# Patient Record
Sex: Female | Born: 1970 | Race: White | Hispanic: No | Marital: Married | Smoking: Former smoker
Health system: Southern US, Community
[De-identification: ages and names within clinical notes are randomized; demographics above are authoritative.]

## PROBLEM LIST (undated history)

## (undated) DIAGNOSIS — N979 Female infertility, unspecified: Secondary | ICD-10-CM

## (undated) DIAGNOSIS — I10 Essential (primary) hypertension: Secondary | ICD-10-CM

## (undated) DIAGNOSIS — R011 Cardiac murmur, unspecified: Secondary | ICD-10-CM

## (undated) DIAGNOSIS — K219 Gastro-esophageal reflux disease without esophagitis: Secondary | ICD-10-CM

## (undated) DIAGNOSIS — J309 Allergic rhinitis, unspecified: Secondary | ICD-10-CM

## (undated) DIAGNOSIS — G4730 Sleep apnea, unspecified: Secondary | ICD-10-CM

## (undated) DIAGNOSIS — D649 Anemia, unspecified: Secondary | ICD-10-CM

## (undated) DIAGNOSIS — D219 Benign neoplasm of connective and other soft tissue, unspecified: Secondary | ICD-10-CM

## (undated) DIAGNOSIS — F329 Major depressive disorder, single episode, unspecified: Secondary | ICD-10-CM

## (undated) DIAGNOSIS — E669 Obesity, unspecified: Secondary | ICD-10-CM

## (undated) DIAGNOSIS — F32A Depression, unspecified: Secondary | ICD-10-CM

## (undated) DIAGNOSIS — K631 Perforation of intestine (nontraumatic): Secondary | ICD-10-CM

## (undated) DIAGNOSIS — K567 Ileus, unspecified: Secondary | ICD-10-CM

## (undated) DIAGNOSIS — I499 Cardiac arrhythmia, unspecified: Secondary | ICD-10-CM

## (undated) HISTORY — PX: DIAGNOSTIC LAPAROSCOPY: SUR761

## (undated) HISTORY — PX: LAPAROSCOPIC GASTRIC BANDING: SHX1100

## (undated) HISTORY — PX: ENDOMETRIAL BIOPSY: SHX622

## (undated) HISTORY — PX: OTHER SURGICAL HISTORY: SHX169

## (undated) HISTORY — PX: LAPAROSCOPIC GASTRIC BAND REMOVAL WITH LAPAROSCOPIC GASTRIC SLEEVE RESECTION: SHX6498

## (undated) HISTORY — PX: ESOPHAGOGASTRODUODENOSCOPY: SHX1529

## (undated) HISTORY — PX: INDUCED ABORTION: SHX677

## (undated) HISTORY — PX: CHOLECYSTECTOMY: SHX55

## (undated) SURGERY — Surgical Case
Anesthesia: *Unknown

---

## 2005-08-07 ENCOUNTER — Emergency Department: Payer: Self-pay | Admitting: General Practice

## 2006-10-08 ENCOUNTER — Ambulatory Visit: Payer: Self-pay | Admitting: Family Medicine

## 2007-01-15 ENCOUNTER — Ambulatory Visit: Payer: Self-pay | Admitting: Emergency Medicine

## 2008-07-20 ENCOUNTER — Ambulatory Visit: Payer: Self-pay | Admitting: Family Medicine

## 2011-05-25 ENCOUNTER — Ambulatory Visit: Payer: Self-pay | Admitting: Internal Medicine

## 2013-07-24 ENCOUNTER — Ambulatory Visit: Payer: Self-pay | Admitting: Obstetrics and Gynecology

## 2013-08-31 ENCOUNTER — Emergency Department: Payer: Self-pay | Admitting: Emergency Medicine

## 2013-08-31 LAB — URINALYSIS, COMPLETE
Bilirubin,UR: NEGATIVE
Glucose,UR: NEGATIVE mg/dL (ref 0–75)
Ketone: NEGATIVE
LEUKOCYTE ESTERASE: NEGATIVE
Nitrite: NEGATIVE
PROTEIN: NEGATIVE
Ph: 5 (ref 4.5–8.0)
SPECIFIC GRAVITY: 1.019 (ref 1.003–1.030)

## 2013-08-31 LAB — COMPREHENSIVE METABOLIC PANEL
AST: 19 U/L (ref 15–37)
Albumin: 3.2 g/dL — ABNORMAL LOW (ref 3.4–5.0)
Alkaline Phosphatase: 46 U/L
Anion Gap: 6 — ABNORMAL LOW (ref 7–16)
BUN: 13 mg/dL (ref 7–18)
Bilirubin,Total: 0.4 mg/dL (ref 0.2–1.0)
CALCIUM: 8.5 mg/dL (ref 8.5–10.1)
CHLORIDE: 110 mmol/L — AB (ref 98–107)
CO2: 21 mmol/L (ref 21–32)
Creatinine: 0.93 mg/dL (ref 0.60–1.30)
GLUCOSE: 104 mg/dL — AB (ref 65–99)
Osmolality: 274 (ref 275–301)
POTASSIUM: 3.9 mmol/L (ref 3.5–5.1)
SGPT (ALT): 20 U/L (ref 12–78)
Sodium: 137 mmol/L (ref 136–145)
TOTAL PROTEIN: 7.5 g/dL (ref 6.4–8.2)

## 2013-08-31 LAB — CBC WITH DIFFERENTIAL/PLATELET
BASOS ABS: 0.1 10*3/uL (ref 0.0–0.1)
Basophil %: 0.4 %
Eosinophil #: 0.1 10*3/uL (ref 0.0–0.7)
Eosinophil %: 0.9 %
HCT: 41.6 % (ref 35.0–47.0)
HGB: 13.9 g/dL (ref 12.0–16.0)
Lymphocyte #: 0.5 10*3/uL — ABNORMAL LOW (ref 1.0–3.6)
Lymphocyte %: 4.3 %
MCH: 29.7 pg (ref 26.0–34.0)
MCHC: 33.4 g/dL (ref 32.0–36.0)
MCV: 89 fL (ref 80–100)
Monocyte #: 0.4 x10 3/mm (ref 0.2–0.9)
Monocyte %: 3.3 %
NEUTROS ABS: 10.8 10*3/uL — AB (ref 1.4–6.5)
Neutrophil %: 91.1 %
Platelet: 267 10*3/uL (ref 150–440)
RBC: 4.68 10*6/uL (ref 3.80–5.20)
RDW: 14 % (ref 11.5–14.5)
WBC: 11.9 10*3/uL — AB (ref 3.6–11.0)

## 2013-08-31 LAB — LIPASE, BLOOD: Lipase: 90 U/L (ref 73–393)

## 2014-03-15 ENCOUNTER — Ambulatory Visit: Payer: Self-pay

## 2014-03-15 ENCOUNTER — Ambulatory Visit: Payer: Self-pay | Admitting: Physician Assistant

## 2014-03-15 LAB — COMPREHENSIVE METABOLIC PANEL
ALBUMIN: 3.2 g/dL — AB (ref 3.4–5.0)
ANION GAP: 6 — AB (ref 7–16)
Alkaline Phosphatase: 59 U/L
BILIRUBIN TOTAL: 0.2 mg/dL (ref 0.2–1.0)
BUN: 15 mg/dL (ref 7–18)
CALCIUM: 8.9 mg/dL (ref 8.5–10.1)
CHLORIDE: 102 mmol/L (ref 98–107)
CO2: 29 mmol/L (ref 21–32)
Creatinine: 0.83 mg/dL (ref 0.60–1.30)
EGFR (African American): >60
Glucose: 78 mg/dL (ref 65–99)
OSMOLALITY: 274 (ref 275–301)
Potassium: 3.7 mmol/L (ref 3.5–5.1)
SGOT(AST): 17 U/L (ref 15–37)
SGPT (ALT): 22 U/L
Sodium: 137 mmol/L (ref 136–145)
Total Protein: 7.4 g/dL (ref 6.4–8.2)

## 2014-03-15 LAB — CBC WITH DIFFERENTIAL/PLATELET
BASOS ABS: 0.1 10*3/uL (ref 0.0–0.1)
Basophil %: 0.8 %
EOS ABS: 0.3 10*3/uL (ref 0.0–0.7)
Eosinophil %: 2.1 %
HCT: 41 % (ref 35.0–47.0)
HGB: 13.1 g/dL (ref 12.0–16.0)
LYMPHS PCT: 27.5 %
Lymphocyte #: 3.4 10*3/uL (ref 1.0–3.6)
MCH: 28.7 pg (ref 26.0–34.0)
MCHC: 31.9 g/dL — ABNORMAL LOW (ref 32.0–36.0)
MCV: 90 fL (ref 80–100)
MONOS PCT: 7.8 %
Monocyte #: 1 x10 3/mm — ABNORMAL HIGH (ref 0.2–0.9)
NEUTROS ABS: 7.6 10*3/uL — AB (ref 1.4–6.5)
Neutrophil %: 61.8 %
Platelet: 302 10*3/uL (ref 150–440)
RBC: 4.55 10*6/uL (ref 3.80–5.20)
RDW: 14.4 % (ref 11.5–14.5)
WBC: 12.4 10*3/uL — ABNORMAL HIGH (ref 3.6–11.0)

## 2014-03-15 LAB — LIPASE, BLOOD: LIPASE: 84 U/L (ref 73–393)

## 2014-03-15 LAB — AMYLASE: Amylase: 28 U/L (ref 25–115)

## 2014-06-10 ENCOUNTER — Ambulatory Visit: Payer: Self-pay | Admitting: Emergency Medicine

## 2014-11-04 ENCOUNTER — Encounter: Payer: Self-pay | Admitting: Emergency Medicine

## 2014-11-04 ENCOUNTER — Ambulatory Visit
Admission: EM | Admit: 2014-11-04 | Discharge: 2014-11-04 | Disposition: A | Discharge status: Home / Self Care | Payer: BC Managed Care – PPO | Attending: Internal Medicine | Admitting: Internal Medicine

## 2014-11-04 DIAGNOSIS — Z8249 Family history of ischemic heart disease and other diseases of the circulatory system: Secondary | ICD-10-CM | POA: Insufficient documentation

## 2014-11-04 DIAGNOSIS — F17210 Nicotine dependence, cigarettes, uncomplicated: Secondary | ICD-10-CM | POA: Insufficient documentation

## 2014-11-04 DIAGNOSIS — Z9049 Acquired absence of other specified parts of digestive tract: Secondary | ICD-10-CM | POA: Insufficient documentation

## 2014-11-04 DIAGNOSIS — R0789 Other chest pain: Secondary | ICD-10-CM | POA: Insufficient documentation

## 2014-11-04 DIAGNOSIS — R002 Palpitations: Secondary | ICD-10-CM

## 2014-11-04 DIAGNOSIS — F329 Major depressive disorder, single episode, unspecified: Secondary | ICD-10-CM | POA: Insufficient documentation

## 2014-11-04 HISTORY — DX: Major depressive disorder, single episode, unspecified: F32.9

## 2014-11-04 HISTORY — DX: Depression, unspecified: F32.A

## 2014-11-04 NOTE — ED Provider Notes (Signed)
CSN: [id number]     Arrival date & time 11/04/14  1454 History   First MD Initiated Contact with Patient 11/04/14 1519     Chief Complaint  Patient presents with  . Chest Pain  . Palpitations    HPI  Patient presents with history of palpitations and vague chest discomfort that are more prominent and usual for the last week. Episodes of palpitations and chest discomfort last for a couple minutes at a time, maybe every other day. She has been noticing this particularly at bedtime when she is going to bed.  She denies excessive caffeine intake, drinks one large coffee over the course of several hours in the morning. She denies use of energy drinks, caffeinated beverages, tea.  She has been sleeping well, denies sleeplessness, excessive stress. She does have a history of sleep apnea, formerly treated with a CPAP device. She has lost a lot of weight, and gave away her CPAP device. Recently she has regained the weight, and thinks she might be having some difficulty with sleep apnea. She gives some history of daytime somnolence.  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 Past Medical History  Diagnosis Date  . Depression    she has been taking Celexa at a stable dose for about 6 years  She denies hypertension, diabetes, hyperlipidemia. She is followed by Gaetano Net at [facility]  Past Surgical History  Procedure Laterality Date  . Cholecystectomy     Family History  Problem Relation Age of Onset  . Heart attack Father    father's first heart attack was about the age of 20. History  Substance Use Topics  . Smoking status: Current Every Day Smoker -- 0.50 packs/day  . Smokeless tobacco: Never Used  . Alcohol Use: 7.2 oz/week    12 Cans of beer per week   patient has been a half pack per day smoker for 20 years OB History    No data available      Review of Systems  All other systems reviewed and are negative.     Allergies  Shrimp  Home Medications   Prior to Admission medications   Medication Sig Start Date End Date Taking? Authorizing Provider  citalopram (CELEXA) 40 MG tablet Take 40 mg by mouth daily.   Yes Historical Provider, MD   BP 114/71 mmHg  Pulse 79  Temp(Src) 96.4 F (35.8 C) (Tympanic)  Ht 5\' 2"  (1.575 m)  Wt 250 lb (113.399 kg)  BMI 45.71 kg/m2  SpO2 98%  LMP  (Approximate) Physical Exam  Constitutional: She is oriented to person, place, and time. She appears well-developed and well-nourished.  HENT:  Head: Atraumatic.  Eyes: EOM are normal.  Neck: Neck supple.  Cardiovascular: Normal rate and normal heart sounds.  Exam reveals no gallop.   No murmur heard. Pulmonary/Chest: Breath sounds normal. No respiratory distress. She has no wheezes. She has no rhonchi.  Abdominal: Soft. She exhibits no distension. There is no tenderness. There is no rebound and no guarding.  Neurological: She is alert and oriented to person, place, and time.  Skin: Skin is warm and dry.  Nursing note and vitals reviewed. no leg swelling   ED Course  Procedures (including critical care time) Labs Review Labs Reviewed - No data to display  Imaging Review No results found.   MDM  No diagnosis found.  palpitations, atypical chest pain. ECGs x 2 reviewed, approx 55 min apart: no acute/evolving ST or T wave changes, QTc unremarkable.  NSR.  No dysrhythmia/ectopy noted.  Minimal cardiac risk factors, but does have hx sleep apnea (with possibility of recurrence), and risk of pulmonary hypertension/dysrhythmia on that basis.  Will followup pcp to discuss further evaluation for this.  Reassurance provided re: continuing to take celexa.   Sherlene Shams, MD 11/04/14 [phone]

## 2014-11-04 NOTE — ED Notes (Signed)
Patient c/o chest pain and palpitations for a week.  Patient denies N/V.  Patient denies SOB.

## 2015-11-06 ENCOUNTER — Other Ambulatory Visit: Payer: Self-pay | Admitting: Family Medicine

## 2015-11-06 DIAGNOSIS — Z1231 Encounter for screening mammogram for malignant neoplasm of breast: Secondary | ICD-10-CM

## 2015-11-20 ENCOUNTER — Ambulatory Visit
Admission: RE | Admit: 2015-11-20 | Discharge: 2015-11-20 | Disposition: A | Discharge status: Home / Self Care | Payer: BC Managed Care – PPO | Source: Ambulatory Visit | Attending: Family Medicine | Admitting: Family Medicine

## 2015-11-20 DIAGNOSIS — Z1231 Encounter for screening mammogram for malignant neoplasm of breast: Secondary | ICD-10-CM | POA: Insufficient documentation

## 2016-03-11 ENCOUNTER — Other Ambulatory Visit: Payer: Self-pay | Admitting: Gastroenterology

## 2016-03-11 DIAGNOSIS — R195 Other fecal abnormalities: Secondary | ICD-10-CM

## 2016-03-11 DIAGNOSIS — R748 Abnormal levels of other serum enzymes: Secondary | ICD-10-CM

## 2016-03-16 ENCOUNTER — Ambulatory Visit
Admission: RE | Admit: 2016-03-16 | Discharge: 2016-03-16 | Disposition: A | Discharge status: Home / Self Care | Payer: BC Managed Care – PPO | Source: Ambulatory Visit | Attending: Gastroenterology | Admitting: Gastroenterology

## 2016-03-16 DIAGNOSIS — R195 Other fecal abnormalities: Secondary | ICD-10-CM | POA: Diagnosis not present

## 2016-03-16 DIAGNOSIS — R748 Abnormal levels of other serum enzymes: Secondary | ICD-10-CM | POA: Diagnosis not present

## 2016-03-16 DIAGNOSIS — R932 Abnormal findings on diagnostic imaging of liver and biliary tract: Secondary | ICD-10-CM | POA: Insufficient documentation

## 2016-03-17 ENCOUNTER — Ambulatory Visit
Admission: EM | Admit: 2016-03-17 | Discharge: 2016-03-17 | Disposition: A | Discharge status: Home / Self Care | Payer: BC Managed Care – PPO

## 2016-03-17 NOTE — ED Triage Notes (Signed)
BP at home 200/93. Pt concerned about BP. Denies symptoms.

## 2016-03-17 NOTE — ED Notes (Signed)
Pt states considering our BP here and that she is asymptomatic she no longer wants to see a physician as she feels she has a defective BP device at home. Physician exam was offered but pt declined.

## 2016-06-15 ENCOUNTER — Encounter: Payer: Self-pay | Admitting: *Deleted

## 2016-06-15 ENCOUNTER — Ambulatory Visit
Admission: EM | Admit: 2016-06-15 | Discharge: 2016-06-15 | Disposition: A | Discharge status: Home / Self Care | Payer: BC Managed Care – PPO | Attending: Family Medicine | Admitting: Family Medicine

## 2016-06-15 DIAGNOSIS — F17200 Nicotine dependence, unspecified, uncomplicated: Secondary | ICD-10-CM | POA: Insufficient documentation

## 2016-06-15 DIAGNOSIS — E669 Obesity, unspecified: Secondary | ICD-10-CM | POA: Insufficient documentation

## 2016-06-15 DIAGNOSIS — R0789 Other chest pain: Secondary | ICD-10-CM | POA: Diagnosis not present

## 2016-06-15 DIAGNOSIS — M940 Chondrocostal junction syndrome [Tietze]: Secondary | ICD-10-CM | POA: Insufficient documentation

## 2016-06-15 NOTE — Discharge Instructions (Signed)
Ice, tylenol as needed Follow up if symptoms change or worsen

## 2016-06-15 NOTE — ED Provider Notes (Signed)
[facility]    CSN: [id number] Arrival date & time: 06/15/16  [id number]     History   Chief Complaint Chief Complaint  Patient presents with  . Chest Pain    HPI Bianca Mercer is a 45 y.o. female.   45 yo female with a c/o left sided "sharp" chest pain that started last night while she was in the kitchen. States it last about 1-2 minutes and then resolved on it's own. This morning woke up and felt the pain again. This morning the pain turned more dull. Denies pain radiating, nausea, vomiting, diaphoresis, fevers, chills, cough, trauma, neck/jaw or left arm pain.    The history is provided by the patient.  Chest Pain  Pain location:  L chest Pain quality: sharp   Pain radiates to:  Does not radiate Pain severity:  Mild Onset quality:  Sudden Duration:  1 day Timing:  Sporadic Progression:  Partially resolved Chronicity:  New Context: at rest   Relieved by:  None tried Ineffective treatments:  None tried Associated symptoms: fatigue   Associated symptoms: no abdominal pain, no back pain, no claudication, no cough, no diaphoresis, no dizziness, no dysphagia, no fever, no headache, no heartburn, no lower extremity edema, no nausea, no near-syncope, no numbness, no orthopnea, no palpitations, no PND, no shortness of breath, no syncope, no vomiting and no weakness   Risk factors: obesity and smoking   Risk factors: no aortic disease, no birth control, no coronary artery disease, no diabetes mellitus, no high cholesterol, no hypertension, no immobilization, not female, not pregnant and no prior DVT/PE     Past Medical History:  Diagnosis Date  . Depression     There are no active problems to display for this patient.   Past Surgical History:  Procedure Laterality Date  . CHOLECYSTECTOMY    . LAPAROSCOPIC GASTRIC BANDING      OB History    No data available       Home Medications    Prior to Admission medications   Medication Sig Start Date End Date  Taking? Authorizing Provider  citalopram (CELEXA) 40 MG tablet Take 40 mg by mouth daily.    Historical Provider, MD    Family History Family History  Problem Relation Age of Onset  . Heart attack Father     Social History Social History  Substance Use Topics  . Smoking status: Current Every Day Smoker  . Smokeless tobacco: Never Used  . Alcohol use 7.2 oz/week    12 Cans of beer per week     Allergies   Shrimp [shellfish allergy]   Review of Systems Review of Systems  Constitutional: Positive for fatigue. Negative for diaphoresis and fever.  HENT: Negative for trouble swallowing.   Respiratory: Negative for cough and shortness of breath.   Cardiovascular: Positive for chest pain. Negative for palpitations, orthopnea, claudication, syncope, PND and near-syncope.  Gastrointestinal: Negative for abdominal pain, heartburn, nausea and vomiting.  Musculoskeletal: Negative for back pain.  Neurological: Negative for dizziness, weakness, numbness and headaches.     Physical Exam Triage Vital Signs ED Triage Vitals  Enc Vitals Group     BP 06/15/16 0849 140/72     Pulse Rate 06/15/16 0849 68     Resp 06/15/16 0849 16     Temp 06/15/16 0849 97 F (36.1 C)     Temp Source 06/15/16 0849 Tympanic     SpO2 06/15/16 0849 99 %     Weight 06/15/16 0850  265 lb (120.2 kg)     Height 06/15/16 0850 5\' 2"  (1.575 m)     Head Circumference --      Peak Flow --      Pain Score 06/15/16 0901 2     Pain Loc --      Pain Edu? --      Excl. in [place]? --    No data found.   Updated Vital Signs BP 140/72 (BP Location: Right Arm)   Pulse 68   Temp 97 F (36.1 C) (Tympanic)   Resp 16   Ht 5\' 2"  (1.575 m)   Wt 265 lb (120.2 kg)   LMP 06/03/2016   SpO2 99%   BMI 48.47 kg/m   Visual Acuity Right Eye Distance:   Left Eye Distance:   Bilateral Distance:    Right Eye Near:   Left Eye Near:    Bilateral Near:     Physical Exam  Constitutional: She is oriented to person, place,  and time. She appears well-developed and well-nourished. No distress.  HENT:  Head: Normocephalic.  Right Ear: Tympanic membrane, external ear and ear canal normal.  Left Ear: Tympanic membrane, external ear and ear canal normal.  Nose: Nose normal.  Mouth/Throat: Oropharynx is clear and moist and mucous membranes are normal.  Eyes: Conjunctivae and EOM are normal. Pupils are equal, round, and reactive to light. Right eye exhibits no discharge. Left eye exhibits no discharge. No scleral icterus.  Neck: Normal range of motion. Neck supple. No JVD present. No tracheal deviation present. No thyromegaly present.  Cardiovascular: Normal rate, regular rhythm, normal heart sounds and intact distal pulses.   No murmur heard. Pulmonary/Chest: Effort normal and breath sounds normal. No stridor. No respiratory distress. She has no wheezes. She has no rales. She exhibits tenderness (mild to palpation; reproducible).  Abdominal: Soft. Bowel sounds are normal. She exhibits no distension and no mass. There is no tenderness. There is no rebound and no guarding.  Musculoskeletal: She exhibits no edema or tenderness.  Lymphadenopathy:    She has no cervical adenopathy.  Neurological: She is alert and oriented to person, place, and time. She has normal reflexes.  Skin: Skin is warm and dry. No rash noted. She is not diaphoretic. No erythema. No pallor.  Psychiatric: She has a normal mood and affect. Her behavior is normal. Judgment and thought content normal.  Nursing note and vitals reviewed.    UC Treatments / Results  Labs (all labs ordered are listed, but only abnormal results are displayed) Labs Reviewed - No data to display  EKG  EKG Interpretation None       Radiology No results found.  Procedures .EKG Date/Time: 06/15/2016 9:28 AM Performed by: Norval Gable Authorized by: Norval Gable   ECG reviewed by ED Physician in the absence of a cardiologist: yes   Previous ECG:     Previous ECG:  Compared to current   Comparison ECG info:  5/16   Similarity:  No change Interpretation:    Interpretation: normal   Rate:    ECG rate assessment: normal   Rhythm:    Rhythm: sinus rhythm   Ectopy:    Ectopy: none   QRS:    QRS axis:  Normal Conduction:    Conduction: normal   ST segments:    ST segments:  Normal T waves:    T waves: normal     (including critical care time)  Medications Ordered in UC Medications - No data to  display   Initial Impression / Assessment and Plan / UC Course  I have reviewed the triage vital signs and the nursing notes.  Pertinent labs & imaging results that were available during my care of the patient were reviewed by me and considered in my medical decision making (see chart for details).  Clinical Course       Final Clinical Impressions(s) / UC Diagnoses   Final diagnoses:  Costochondritis, acute  Chest wall pain    New Prescriptions Discharge Medication List as of 06/15/2016  9:20 AM     1. ekg results and diagnosis reviewed with patient 2. Recommend supportive treatment with rest, ice, otc analgesics prn 3. Follow-up prn if symptoms worsen or don't improve   Norval Gable, MD 06/15/16 0930

## 2016-06-15 NOTE — ED Triage Notes (Signed)
Patient started having sharp chest pain in her left upper chest last PM which resolved. This AM the patient's chest is aching with a flutter feeling.

## 2016-11-19 ENCOUNTER — Ambulatory Visit
Admission: EM | Admit: 2016-11-19 | Discharge: 2016-11-19 | Disposition: A | Discharge status: Home / Self Care | Payer: BC Managed Care – PPO | Attending: Family Medicine | Admitting: Family Medicine

## 2016-11-19 DIAGNOSIS — K1120 Sialoadenitis, unspecified: Secondary | ICD-10-CM

## 2016-11-19 DIAGNOSIS — K118 Other diseases of salivary glands: Secondary | ICD-10-CM | POA: Diagnosis not present

## 2016-11-19 MED ORDER — CEFUROXIME AXETIL 250 MG PO TABS: 250.0000 mg | ORAL_TABLET | Freq: Two times a day (BID) | ORAL | 0 refills | Status: AC

## 2016-11-19 NOTE — Discharge Instructions (Signed)
-  Ceftin one tablet twice daily for 7 days -can gargle warm salt water to help with pain and irritation -if no improvement with antibiotic, there is a possibility of having a salivary gland stone and this will need to be evaluated by an ENT physician. -can use OTC Tylenol or ibuprofen for pain -return to clinic or PCP should symptoms worsen or not improve.

## 2016-11-19 NOTE — ED Provider Notes (Signed)
CSN: [id number]     Arrival date & time 11/19/16  1735 History   First MD Initiated Contact with Patient 11/19/16 1750     Chief Complaint  Patient presents with  . Jaw Pain    left   (Consider location/radiation/quality/duration/timing/severity/associated sxs/prior Treatment) -Patient is a 46 year old female with a past history of depression on Celexa who presents with complaint of pain under her left jaw that began yesterday and did not improve overnight. Patient has not taken any over-the-counter medicines for the pain. Patient denies any ear pain, sinus issues, sore throat. 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 Patient denies fever, chills, shortness of breath. She does report some off-and-on palpitations. Patient denies any shortness of breath or difficulty swallowing.      Past Medical History:  Diagnosis Date  . Depression    Past Surgical History:  Procedure Laterality Date  . CHOLECYSTECTOMY    . LAPAROSCOPIC GASTRIC BANDING     Family History  Problem Relation Age of Onset  . Heart attack Father    Social History  Substance Use Topics  . Smoking status: Current Every Day Smoker  . Smokeless tobacco: Never Used  . Alcohol use 7.2 oz/week    12 Cans of beer per week   OB History    No data available     Review of Systems  Constitutional: Negative.  Negative for chills and fever.  HENT: Negative.        Tenderness under the left jaw as noted above  Eyes: Negative.   Respiratory: Positive for shortness of breath.   Cardiovascular: Positive for palpitations (occasional off-and-on). Negative for chest pain.  Genitourinary: Negative.   Neurological: Negative.     Allergies  Shrimp [shellfish allergy]  Home Medications   Prior to Admission medications   Medication Sig Start Date End Date Taking? Authorizing Provider  cefUROXime (CEFTIN) 250 MG tablet Take 1 tablet (250 mg total) by mouth 2 (two) times daily  with a meal. 11/19/16 11/26/16  Luvenia Redden, PA-C  citalopram (CELEXA) 40 MG tablet Take 40 mg by mouth daily.    [provider]   Meds Ordered and Administered this Visit  Medications - No data to display  BP 119/73 (BP Location: Left Arm)   Pulse 80   Temp 98.8 F (37.1 C) (Oral)   Resp 18   Ht 5\' 2"  (1.575 m)   Wt 265 lb (120.2 kg)   LMP 11/12/2016   SpO2 100%   BMI 48.47 kg/m  No data found.   Physical Exam  Constitutional: She is oriented to person, place, and time. She appears well-developed and well-nourished.  HENT:  Head: Normocephalic and atraumatic.  Right Ear: Tympanic membrane and ear canal normal.  Left Ear: Tympanic membrane and ear canal normal.  Nose: Nose normal. Right sinus exhibits no maxillary sinus tenderness and no frontal sinus tenderness. Left sinus exhibits no maxillary sinus tenderness and no frontal sinus tenderness.  Mouth/Throat: Uvula is midline, oropharynx is clear and moist and mucous membranes are normal.  Eyes: EOM are normal. Pupils are equal, round, and reactive to light.  Neck: Normal range of motion, full passive range of motion without pain and phonation normal. No tracheal deviation present.    Cardiovascular: Normal rate, regular rhythm and normal heart sounds.   Pulmonary/Chest: Effort normal and breath sounds normal. No stridor.  Abdominal: Soft.  Musculoskeletal: Normal range of motion.  Lymphadenopathy:  She has no cervical adenopathy.  Neurological: She is alert and oriented to person, place, and time.  Skin: Skin is dry.    Urgent Care Course     Procedures  Labs Review Labs Reviewed - No data to display  Imaging Review No results found.    MDM   1. Submandibular gland inflammation    Patient is a 46 year old female female who presents with pain under her left jaw since last night. Patient with good salivary production and no recent dental injuries or issues. Left submandibular gland is tender to  palpation and enlarged compared to the right. No difficulty swallowing or breathing. Will give patient a seven-day course of Ceftin. Patient advised if she has no improvement after the antibiotic course that there is possibility that she could have a salivary gland stone which would need to be evaluated by ENT physician. Patient advised she can return to clinic or see her PCP should her symptoms improve or worsen unless as noted above for possible stone. Patient verbalized understanding and is in agreement with plan.  Luvenia Redden, PA-C     Luvenia Redden, PA-C 11/19/16 [phone]

## 2016-11-19 NOTE — ED Triage Notes (Signed)
Pt c/o left sided neck and jaw pain. She says it started last night and she didn't go to work today because of it. She points to the area where the lymph nodes are located on the side of the neck.

## 2016-12-10 ENCOUNTER — Other Ambulatory Visit: Payer: Self-pay | Admitting: Family Medicine

## 2016-12-10 DIAGNOSIS — Z1231 Encounter for screening mammogram for malignant neoplasm of breast: Secondary | ICD-10-CM

## 2016-12-24 ENCOUNTER — Ambulatory Visit
Admission: RE | Admit: 2016-12-24 | Discharge: 2016-12-24 | Disposition: A | Discharge status: Home / Self Care | Payer: BC Managed Care – PPO | Source: Ambulatory Visit | Attending: Family Medicine | Admitting: Family Medicine

## 2016-12-24 DIAGNOSIS — Z1231 Encounter for screening mammogram for malignant neoplasm of breast: Secondary | ICD-10-CM | POA: Insufficient documentation

## 2017-03-09 ENCOUNTER — Other Ambulatory Visit: Payer: Self-pay | Admitting: Nurse Practitioner

## 2017-03-09 DIAGNOSIS — R748 Abnormal levels of other serum enzymes: Secondary | ICD-10-CM

## 2017-03-09 DIAGNOSIS — R195 Other fecal abnormalities: Secondary | ICD-10-CM

## 2017-03-19 ENCOUNTER — Ambulatory Visit
Admission: RE | Admit: 2017-03-19 | Discharge: 2017-03-19 | Disposition: A | Discharge status: Home / Self Care | Payer: BC Managed Care – PPO | Source: Ambulatory Visit | Attending: Nurse Practitioner | Admitting: Nurse Practitioner

## 2017-03-19 DIAGNOSIS — R195 Other fecal abnormalities: Secondary | ICD-10-CM | POA: Insufficient documentation

## 2017-03-19 DIAGNOSIS — R748 Abnormal levels of other serum enzymes: Secondary | ICD-10-CM | POA: Insufficient documentation

## 2017-03-19 DIAGNOSIS — R160 Hepatomegaly, not elsewhere classified: Secondary | ICD-10-CM | POA: Diagnosis not present

## 2017-04-21 ENCOUNTER — Other Ambulatory Visit: Payer: Self-pay | Admitting: Gastroenterology

## 2017-04-21 DIAGNOSIS — K769 Liver disease, unspecified: Secondary | ICD-10-CM

## 2017-04-23 ENCOUNTER — Ambulatory Visit
Admission: RE | Admit: 2017-04-23 | Discharge: 2017-04-23 | Disposition: A | Discharge status: Home / Self Care | Payer: BC Managed Care – PPO | Source: Ambulatory Visit | Attending: Gastroenterology | Admitting: Gastroenterology

## 2017-04-23 DIAGNOSIS — D1809 Hemangioma of other sites: Secondary | ICD-10-CM | POA: Insufficient documentation

## 2017-04-23 DIAGNOSIS — K769 Liver disease, unspecified: Secondary | ICD-10-CM | POA: Diagnosis not present

## 2017-04-23 DIAGNOSIS — Z9049 Acquired absence of other specified parts of digestive tract: Secondary | ICD-10-CM | POA: Diagnosis not present

## 2017-04-23 MED ORDER — GADOBENATE DIMEGLUMINE 529 MG/ML IV SOLN
20.0000 mL | Freq: Once | INTRAVENOUS | Status: AC | PRN
  Administered 2017-04-23: 20 mL via INTRAVENOUS

## 2017-11-11 ENCOUNTER — Other Ambulatory Visit: Payer: Self-pay

## 2017-11-11 ENCOUNTER — Ambulatory Visit
Admission: EM | Admit: 2017-11-11 | Discharge: 2017-11-11 | Disposition: A | Discharge status: Other Acute Inpatient Hospital | Payer: BC Managed Care – PPO | Attending: Family Medicine | Admitting: Family Medicine

## 2017-11-11 DIAGNOSIS — Z87891 Personal history of nicotine dependence: Secondary | ICD-10-CM | POA: Diagnosis not present

## 2017-11-11 DIAGNOSIS — Z9049 Acquired absence of other specified parts of digestive tract: Secondary | ICD-10-CM | POA: Insufficient documentation

## 2017-11-11 DIAGNOSIS — R0602 Shortness of breath: Secondary | ICD-10-CM | POA: Diagnosis not present

## 2017-11-11 DIAGNOSIS — Z79899 Other long term (current) drug therapy: Secondary | ICD-10-CM | POA: Insufficient documentation

## 2017-11-11 DIAGNOSIS — Z79891 Long term (current) use of opiate analgesic: Secondary | ICD-10-CM | POA: Insufficient documentation

## 2017-11-11 DIAGNOSIS — Z91013 Allergy to seafood: Secondary | ICD-10-CM | POA: Diagnosis not present

## 2017-11-11 DIAGNOSIS — R079 Chest pain, unspecified: Secondary | ICD-10-CM | POA: Diagnosis present

## 2017-11-11 DIAGNOSIS — Z888 Allergy status to other drugs, medicaments and biological substances status: Secondary | ICD-10-CM | POA: Diagnosis not present

## 2017-11-11 DIAGNOSIS — F329 Major depressive disorder, single episode, unspecified: Secondary | ICD-10-CM | POA: Insufficient documentation

## 2017-11-11 HISTORY — DX: Obesity, unspecified: E66.9

## 2017-11-11 MED ORDER — SIMETHICONE 80 MG PO CHEW
80.00 | CHEWABLE_TABLET | ORAL | Status: DC
Start: ? — End: 2017-11-11

## 2017-11-11 MED ORDER — CITALOPRAM HYDROBROMIDE 40 MG PO TABS
40.00 | ORAL_TABLET | ORAL | Status: DC
Start: 2017-11-10 — End: 2017-11-11

## 2017-11-11 MED ORDER — SCOPOLAMINE 1 MG/3DAYS TD PT72
1.00 | MEDICATED_PATCH | TRANSDERMAL | Status: DC
Start: 2017-11-12 — End: 2017-11-11

## 2017-11-11 MED ORDER — ONDANSETRON 4 MG PO TBDP
4.00 | ORAL_TABLET | ORAL | Status: DC
Start: ? — End: 2017-11-11

## 2017-11-11 MED ORDER — PHENOL 1.4 % MT LIQD
2.00 | OROMUCOSAL | Status: DC
Start: ? — End: 2017-11-11

## 2017-11-11 MED ORDER — HEPARIN SODIUM (PORCINE) 5000 UNIT/ML IJ SOLN
5000.00 | INTRAMUSCULAR | Status: DC
Start: 2017-11-09 — End: 2017-11-11

## 2017-11-11 MED ORDER — PANTOPRAZOLE SODIUM 40 MG IV SOLR
40.00 | INTRAVENOUS | Status: DC
Start: 2017-11-10 — End: 2017-11-11

## 2017-11-11 MED ORDER — OXYCODONE HCL 5 MG PO TABS
5.00 | ORAL_TABLET | ORAL | Status: DC
Start: ? — End: 2017-11-11

## 2017-11-11 MED ORDER — GENERIC EXTERNAL MEDICATION
Status: DC
Start: ? — End: 2017-11-11

## 2017-11-11 MED ORDER — PROMETHAZINE HCL 12.5 MG PO TABS
12.50 | ORAL_TABLET | ORAL | Status: DC
Start: ? — End: 2017-11-11

## 2017-11-11 NOTE — ED Provider Notes (Addendum)
[facility]  CSN: [id number] Arrival date & time: 11/11/17  1605  History   Chief Complaint Chief Complaint  Patient presents with  . Chest Pain   HPI  47 year old female presents with chest pain.  Patient recently had her lap band removed.  She has had postoperative complications.  Patient had a perforated stomach.  She was admitted and it was repaired.  She was subsequently discharged.  Patient subsequently developed fever and severe abdominal pain.  She was seen at [facility] and was found to have she was admitted and subsequently discharged on 5/7.  She states that wound infection.  Last night she developed severe chest pain.  She said ongoing gas, belching, burping.  She states that her pain is severe and has persisted.  It is currently 9/10 in severity.  Improves slightly when she sits forward.  Worsens when she lies back.  Associated shortness of breath.  No fevers.  No chills.  No other associated symptoms.  No other complaints or concerns at this time.  Past Medical History:  Diagnosis Date  . Depression   . Depression   . Obese    Past Surgical History:  Procedure Laterality Date  . CHOLECYSTECTOMY    . LAPAROSCOPIC GASTRIC BAND REMOVAL WITH LAPAROSCOPIC GASTRIC SLEEVE RESECTION    . LAPAROSCOPIC GASTRIC BANDING      OB History   None    Home Medications    Prior to Admission medications   Medication Sig Start Date End Date Taking? Authorizing Provider  oxyCODONE (OXY IR/ROXICODONE) 5 MG immediate release tablet Take by mouth. 11/09/17 11/14/17 Yes [provider]  citalopram (CELEXA) 40 MG tablet Take 40 mg by mouth daily.    [provider]  omeprazole (PRILOSEC) 20 MG capsule TAKE 2 TABLETS (40 MG TOTAL) BY MOUTH ONCE DAILY 10/28/17   [provider]  ondansetron (ZOFRAN-ODT) 4 MG disintegrating tablet Take 4 mg by mouth every 6 (six) hours as needed. 10/22/17   [provider]  promethazine (PHENERGAN) 12.5 MG tablet  11/09/17    [provider]    Family History Family History  Problem Relation Age of Onset  . Heart attack Father   . Breast cancer Maternal Grandmother     Social History Social History   Tobacco Use  . Smoking status: Former Smoker    Last attempt to quit: 10/21/2017    Years since quitting: 0.0  . Smokeless tobacco: Never Used  Substance Use Topics  . Alcohol use: Not Currently    Alcohol/week: 7.2 oz    Types: 12 Cans of beer per week  . Drug use: No     Allergies   Benadryl [diphenhydramine] and Shrimp [shellfish allergy]   Review of Systems Review of Systems  Respiratory: Positive for shortness of breath.   Cardiovascular: Positive for chest pain.  Gastrointestinal:       Gas, belching.   Physical Exam Triage Vital Signs ED Triage Vitals  Enc Vitals Group     BP 11/11/17 1610 129/65     Pulse Rate 11/11/17 1610 83     Resp 11/11/17 1610 20     Temp 11/11/17 1610 98.2 F (36.8 C)     Temp src --      SpO2 11/11/17 1610 100 %     Weight 11/11/17 1611 235 lb (106.6 kg)     Height 11/11/17 1611 5\' 2"  (1.575 m)     Head Circumference --      Peak Flow --  Pain Score 11/11/17 1610 8     Pain Loc --      Pain Edu? --      Excl. in [place]? --    Updated Vital Signs BP 129/65 (BP Location: Left Arm)   Pulse 83   Temp 98.2 F (36.8 C)   Resp 20   Ht 5\' 2"  (1.575 m)   Wt 235 lb (106.6 kg)   LMP 10/23/2017   SpO2 100%   BMI 42.98 kg/m   Physical Exam  Constitutional: She is oriented to person, place, and time. She appears well-developed.  Appears distressed secondary to pain.  HENT:  Head: Normocephalic and atraumatic.  Cardiovascular: Normal rate and regular rhythm.  Pulmonary/Chest: Effort normal and breath sounds normal. She has no wheezes. She has no rales. She exhibits tenderness.  Abdominal: Soft.  Abdominal wounds without erythema. No purulent drainage.  Neurological: She is oriented to person, place, and time.  Psychiatric:  Anxious.    Nursing note and vitals reviewed.  UC Treatments / Results  Labs (all labs ordered are listed, but only abnormal results are displayed) Labs Reviewed - No data to display  EKG Interpreted: Normal sinus rhythm with a rate of 76.  Normal axis.  LAE.  No acute ST or T wave changes.  Radiology No results found.  Procedures Procedures (including critical care time)  Medications Ordered in UC Medications - No data to display  Initial Impression / Assessment and Plan / UC Course  I have reviewed the triage vital signs and the nursing notes.  Pertinent labs & imaging results that were available during my care of the patient were reviewed by me and considered in my medical decision making (see chart for details).    47 year old female presents with chest pain.  Severe.  Etiology unclear at this time. Does not appear to be cardiac in nature. Given recent surgery and complications and resources at [facility], EMS was called and patient was transported to the hospital for further evaluation and treatment.  Final Clinical Impressions(s) / UC Diagnoses   Final diagnoses:  Chest pain, unspecified type   Discharge Instructions   None    ED Prescriptions    None     Controlled Substance Prescriptions [place] Controlled Substance Registry consulted? Not Applicable   Coral Spikes, DO 11/11/17 [place], [place], DO 11/11/17 1711

## 2017-11-11 NOTE — ED Triage Notes (Addendum)
Pt with sharp left chest pain and feels hard to breathe since 10:00 last night. Breathes much easier when sitting straight up. Pain 9/10. Pt burping in triage. Pt discharged from [facility] 2 days ago for complications following lap band removal

## 2017-11-12 MED ORDER — GENERIC EXTERNAL MEDICATION
Status: DC
Start: ? — End: 2017-11-12

## 2017-11-12 MED ORDER — PANTOPRAZOLE SODIUM 20 MG PO TBEC
20.00 | DELAYED_RELEASE_TABLET | ORAL | Status: DC
Start: 2017-11-13 — End: 2017-11-12

## 2017-11-12 MED ORDER — FLUTICASONE PROPIONATE 50 MCG/ACT NA SUSP
1.00 | NASAL | Status: DC
Start: 2017-11-13 — End: 2017-11-12

## 2017-11-12 MED ORDER — ACETAMINOPHEN 500 MG PO TABS
1000.00 | ORAL_TABLET | ORAL | Status: DC
Start: 2017-11-12 — End: 2017-11-12

## 2017-11-12 MED ORDER — KETOROLAC TROMETHAMINE 30 MG/ML IJ SOLN
15.00 | INTRAMUSCULAR | Status: DC
Start: 2017-11-12 — End: 2017-11-12

## 2017-11-12 MED ORDER — CITALOPRAM HYDROBROMIDE 40 MG PO TABS
40.00 | ORAL_TABLET | ORAL | Status: DC
Start: 2017-11-13 — End: 2017-11-12

## 2017-11-12 MED ORDER — HEPARIN SODIUM (PORCINE) 5000 UNIT/ML IJ SOLN
5000.00 | INTRAMUSCULAR | Status: DC
Start: 2017-11-12 — End: 2017-11-12

## 2018-01-18 ENCOUNTER — Other Ambulatory Visit: Payer: Self-pay | Admitting: Family Medicine

## 2018-01-18 ENCOUNTER — Other Ambulatory Visit: Payer: Self-pay | Admitting: Obstetrics & Gynecology

## 2018-01-18 DIAGNOSIS — Z1231 Encounter for screening mammogram for malignant neoplasm of breast: Secondary | ICD-10-CM

## 2018-01-24 ENCOUNTER — Ambulatory Visit
Admission: RE | Admit: 2018-01-24 | Discharge: 2018-01-24 | Disposition: A | Discharge status: Home / Self Care | Payer: BC Managed Care – PPO | Source: Ambulatory Visit | Attending: Obstetrics & Gynecology | Admitting: Obstetrics & Gynecology

## 2018-01-24 DIAGNOSIS — Z1231 Encounter for screening mammogram for malignant neoplasm of breast: Secondary | ICD-10-CM | POA: Diagnosis not present

## 2018-10-19 ENCOUNTER — Other Ambulatory Visit: Payer: Self-pay | Admitting: Gastroenterology

## 2018-10-19 ENCOUNTER — Other Ambulatory Visit (HOSPITAL_COMMUNITY): Payer: Self-pay | Admitting: Gastroenterology

## 2018-10-19 DIAGNOSIS — R1013 Epigastric pain: Secondary | ICD-10-CM

## 2018-11-24 ENCOUNTER — Other Ambulatory Visit: Payer: Self-pay | Admitting: Family Medicine

## 2018-11-24 DIAGNOSIS — Z1231 Encounter for screening mammogram for malignant neoplasm of breast: Secondary | ICD-10-CM

## 2018-12-05 ENCOUNTER — Ambulatory Visit: Payer: BC Managed Care – PPO

## 2019-01-27 ENCOUNTER — Ambulatory Visit
Admission: RE | Admit: 2019-01-27 | Discharge: 2019-01-27 | Disposition: A | Discharge status: Home / Self Care | Payer: BC Managed Care – PPO | Source: Ambulatory Visit | Attending: Gastroenterology | Admitting: Gastroenterology

## 2019-01-27 ENCOUNTER — Other Ambulatory Visit: Payer: Self-pay

## 2019-01-27 DIAGNOSIS — R1013 Epigastric pain: Secondary | ICD-10-CM | POA: Diagnosis present

## 2019-01-30 ENCOUNTER — Ambulatory Visit: Payer: BC Managed Care – PPO

## 2019-02-07 ENCOUNTER — Inpatient Hospital Stay: Admission: RE | Admit: 2019-02-07 | Payer: BC Managed Care – PPO | Source: Ambulatory Visit

## 2019-03-14 ENCOUNTER — Inpatient Hospital Stay: Admission: RE | Admit: 2019-03-14 | Payer: BC Managed Care – PPO | Source: Ambulatory Visit

## 2019-06-24 ENCOUNTER — Other Ambulatory Visit: Payer: Self-pay

## 2019-06-24 ENCOUNTER — Ambulatory Visit
Admission: EM | Admit: 2019-06-24 | Discharge: 2019-06-24 | Discharge status: Against Medical Advice | Payer: BC Managed Care – PPO | Attending: Family Medicine | Admitting: Family Medicine

## 2019-06-24 ENCOUNTER — Encounter: Payer: Self-pay | Admitting: Emergency Medicine

## 2019-06-24 NOTE — ED Triage Notes (Addendum)
Patient in today c/o elevated bp readings off & on x 2 months. Patient states that it is usually ok in the mornings, but "goes sky high" in the evening. Patient does not take any HTN medications. Patient's readings at home last night was 176/105 taken with wrist monitor. Patient does state that she gets some sob.

## 2019-06-24 NOTE — ED Triage Notes (Signed)
Patient waited a couple minutes and took her bp with her wrist monitor and it read 149/110.

## 2019-09-13 ENCOUNTER — Other Ambulatory Visit: Payer: Self-pay | Admitting: Family Medicine

## 2019-09-13 DIAGNOSIS — Z1231 Encounter for screening mammogram for malignant neoplasm of breast: Secondary | ICD-10-CM

## 2019-10-10 ENCOUNTER — Ambulatory Visit
Admission: RE | Admit: 2019-10-10 | Discharge: 2019-10-10 | Disposition: A | Discharge status: Home / Self Care | Payer: BC Managed Care – PPO | Source: Ambulatory Visit | Attending: Family Medicine | Admitting: Family Medicine

## 2019-10-10 ENCOUNTER — Other Ambulatory Visit: Payer: Self-pay

## 2019-10-10 ENCOUNTER — Encounter (INDEPENDENT_AMBULATORY_CARE_PROVIDER_SITE_OTHER): Payer: Self-pay

## 2019-10-10 DIAGNOSIS — Z1231 Encounter for screening mammogram for malignant neoplasm of breast: Secondary | ICD-10-CM | POA: Insufficient documentation

## 2020-07-02 ENCOUNTER — Ambulatory Visit
Admission: EM | Admit: 2020-07-02 | Discharge: 2020-07-02 | Disposition: A | Discharge status: Home / Self Care | Payer: BC Managed Care – PPO

## 2020-07-02 ENCOUNTER — Other Ambulatory Visit: Payer: Self-pay

## 2020-07-02 DIAGNOSIS — L03113 Cellulitis of right upper limb: Secondary | ICD-10-CM

## 2020-07-02 MED ORDER — DOXYCYCLINE HYCLATE 100 MG PO CAPS: 100.0000 mg | ORAL_CAPSULE | Freq: Two times a day (BID) | ORAL | 0 refills | Status: DC

## 2020-07-02 NOTE — ED Triage Notes (Signed)
Pt is here with a right middle finger infection that started, pt has not taken any meds to relieve discomfort.

## 2020-07-02 NOTE — Discharge Instructions (Addendum)
Take the doxycycline twice daily with food for 10 days.  Take an over-the-counter probiotic 1 hour after each dose of antibiotic to prevent diarrhea.  Soak your finger in warm water and Epson salts twice daily to help draw out any remaining infection.  If you develop increased redness or swelling at the site, or red streaks going up your finger, you need to go to the ER for evaluation.

## 2020-07-02 NOTE — ED Provider Notes (Signed)
[facility]    CSN: [id number] Arrival date & time: 07/02/20  0848      History   Chief Complaint Chief Complaint  Patient presents with   finger infection    HPI Bianca Mercer is a 49 y.o. female.   HPI  33-year-old female here for evaluation of redness and swelling to her right middle finger.  Patient reports that her symptoms started out as a white bump in the middle of her right middle finger a week ago.  She then said started to develop some redness so she and her husband put a pin in it and were able to express some white discharge.  The redness has since increased and she is now having pain in her finger joint.  Patient has full range of motion and sensation.  Patient denies fever. Past Medical History:  Diagnosis Date   Depression    Depression    Obese     There are no problems to display for this patient.   Past Surgical History:  Procedure Laterality Date   CHOLECYSTECTOMY     LAPAROSCOPIC GASTRIC BAND REMOVAL WITH LAPAROSCOPIC GASTRIC SLEEVE RESECTION     LAPAROSCOPIC GASTRIC BANDING      OB History   No obstetric history on file.      Home Medications    Prior to Admission medications   Medication Sig Start Date End Date Taking? Authorizing Provider  citalopram (CELEXA) 40 MG tablet Take by mouth. 08/04/10  Yes [provider]  doxycycline (VIBRAMYCIN) 100 MG capsule Take 1 capsule (100 mg total) by mouth 2 (two) times daily. 07/02/20  Yes Margarette Canada, NP  metFORMIN (GLUCOPHAGE-XR) 500 MG 24 hr tablet Take by mouth. 11/02/19 07/02/20 Yes [provider]  citalopram (CELEXA) 40 MG tablet Take 40 mg by mouth daily.    [provider]  fluticasone (FLONASE) 50 MCG/ACT nasal spray Place into the nose.    [provider]  omeprazole (PRILOSEC) 20 MG capsule TAKE 2 TABLETS (40 MG TOTAL) BY MOUTH ONCE DAILY 10/28/17   [provider]  ondansetron (ZOFRAN-ODT) 4 MG disintegrating tablet Take 4  mg by mouth every 6 (six) hours as needed. 10/22/17   [provider]  pantoprazole (PROTONIX) 40 MG tablet Take by mouth. 10/19/18   [provider]  promethazine (PHENERGAN) 12.5 MG tablet  11/09/17   [provider]    Family History Family History  Problem Relation Age of Onset   Hypertension Mother    Heart attack Father    Congenital heart disease Father    Hypertension Father    Hyperlipidemia Father    Breast cancer Maternal Grandmother     Social History Social History   Tobacco Use   Smoking status: Former Smoker    Types: Cigarettes    Quit date: 10/21/2017    Years since quitting: 2.6   Smokeless tobacco: Never Used  Vaping Use   Vaping Use: Former  Substance Use Topics   Alcohol use: Yes    Alcohol/week: 12.0 standard drinks    Types: 12 Cans of beer per week   Drug use: No     Allergies   Benadryl [diphenhydramine], Diphenhydramine hcl, Shellfish allergy, and Shrimp extract allergy skin test   Review of Systems Review of Systems  Constitutional: Negative for fever.  Musculoskeletal: Positive for arthralgias and joint swelling.  Skin: Positive for color change. Negative for wound.     Physical Exam Triage Vital Signs ED Triage Vitals  Enc Vitals Group     BP 07/02/20 1002 (!) 136/92     Pulse Rate 07/02/20 1002 63     Resp 07/02/20 1002 19     Temp 07/02/20 1002 98.3 F (36.8 C)     Temp Source 07/02/20 1002 Oral     SpO2 07/02/20 1002 99 %     Weight --      Height --      Head Circumference --      Peak Flow --      Pain Score 07/02/20 1000 0     Pain Loc --      Pain Edu? --      Excl. in [place]? --    No data found.  Updated Vital Signs BP (!) 136/92 (BP Location: Left Arm)    Pulse 63    Temp 98.3 F (36.8 C) (Oral)    Resp 19    SpO2 99%   Visual Acuity Right Eye Distance:   Left Eye Distance:   Bilateral Distance:    Right Eye Near:   Left Eye Near:    Bilateral Near:     Physical  Exam Vitals and nursing note reviewed.  Constitutional:      General: She is not in acute distress.    Appearance: Normal appearance. She is obese. She is not ill-appearing.  HENT:     Head: Normocephalic and atraumatic.  Cardiovascular:     Rate and Rhythm: Normal rate and regular rhythm.     Pulses: Normal pulses.     Heart sounds: Normal heart sounds. No murmur heard. No gallop.   Pulmonary:     Effort: Pulmonary effort is normal.     Breath sounds: Normal breath sounds. No wheezing, rhonchi or rales.  Skin:    General: Skin is warm and dry.     Capillary Refill: Capillary refill takes less than 2 seconds.     Findings: Erythema and lesion present.  Neurological:     General: No focal deficit present.     Mental Status: She is alert and oriented to person, place, and time.  Psychiatric:        Mood and Affect: Mood normal.        Behavior: Behavior normal.        Thought Content: Thought content normal.        Judgment: Judgment normal.      UC Treatments / Results  Labs (all labs ordered are listed, but only abnormal results are displayed) Labs Reviewed - No data to display  EKG   Radiology No results found.  Procedures Procedures (including critical care time)  Medications Ordered in UC Medications - No data to display  Initial Impression / Assessment and Plan / UC Course  I have reviewed the triage vital signs and the nursing notes.  Pertinent labs & imaging results that were available during my care of the patient were reviewed by me and considered in my medical decision making (see chart for details).   Patient is here for evaluation of pain, swelling, and redness to the volar aspect of the middle phalanx of her right middle finger.  Patient has some mild swelling and tenderness of the PIP joint of the same finger.  Patient does have full range of motion and full sensation.  The area in question is erythematous, indurated, warm, and tender to touch.  There  is no fluctuance noted.  Physical exam is consistent with cellulitis.  Patient states that  she can only take small pills or else she needs liquid.  Therefore, I will treat with doxycycline twice daily x7 days and have patient soak her finger in warm Epson salts twice daily.  Patient that if she develops increase swelling, pain, or red streaks going up her finger into her hand that she needs to go to the ER for evaluation.   Final Clinical Impressions(s) / UC Diagnoses   Final diagnoses:  Cellulitis of right upper extremity     Discharge Instructions     Take the doxycycline twice daily with food for 10 days.  Take an over-the-counter probiotic 1 hour after each dose of antibiotic to prevent diarrhea.  Soak your finger in warm water and Epson salts twice daily to help draw out any remaining infection.  If you develop increased redness or swelling at the site, or red streaks going up your finger, you need to go to the ER for evaluation.    ED Prescriptions    Medication Sig Dispense Auth. Provider   doxycycline (VIBRAMYCIN) 100 MG capsule Take 1 capsule (100 mg total) by mouth 2 (two) times daily. 20 capsule Becky Augusta, NP     PDMP not reviewed this encounter.   Becky Augusta, NP 07/02/20 1031

## 2020-08-02 ENCOUNTER — Ambulatory Visit: Admit: 2020-08-02 | Disposition: A | Discharge status: Other Acute Inpatient Hospital | Payer: BC Managed Care – PPO

## 2020-08-04 ENCOUNTER — Other Ambulatory Visit: Payer: Self-pay

## 2020-08-04 ENCOUNTER — Ambulatory Visit
Admission: EM | Admit: 2020-08-04 | Discharge: 2020-08-04 | Disposition: A | Discharge status: Home / Self Care | Payer: HRSA Program | Attending: Family Medicine | Admitting: Family Medicine

## 2020-08-04 ENCOUNTER — Encounter: Payer: Self-pay | Admitting: Emergency Medicine

## 2020-08-04 DIAGNOSIS — B9789 Other viral agents as the cause of diseases classified elsewhere: Secondary | ICD-10-CM | POA: Diagnosis present

## 2020-08-04 DIAGNOSIS — Z20822 Contact with and (suspected) exposure to covid-19: Secondary | ICD-10-CM

## 2020-08-04 DIAGNOSIS — J988 Other specified respiratory disorders: Secondary | ICD-10-CM

## 2020-08-04 NOTE — Discharge Instructions (Signed)
Rest. Lots of fluids.  Stay home.  Check my chart for COVID test results.  Take care  Dr. Lacinda Axon

## 2020-08-04 NOTE — ED Triage Notes (Signed)
Patient states that her husband was diagnosed with covid last week.  Patient states that last night she started having dizziness, cough, and congestion.  Patient denies fevers.

## 2020-08-04 NOTE — ED Provider Notes (Signed)
[facility]    CSN: [id number] Arrival date & time: 08/04/20  1238      History   Chief Complaint Chief Complaint  Patient presents with  . Covid Exposure  . Cough   HPI  50 year old female presents with the above complaints.  Patient's husband has tested positive for COVID-19.  She states that she developed symptoms as of last night.  She reports cough, congestion, dizziness.  No fever.  No relieving factors.  No shortness of breath.  She desires Covid testing today.  No other associated symptoms.  No other complaints.  Past Medical History:  Diagnosis Date  . Depression   . Depression   . Obese    Past Surgical History:  Procedure Laterality Date  . CHOLECYSTECTOMY    . LAPAROSCOPIC GASTRIC BAND REMOVAL WITH LAPAROSCOPIC GASTRIC SLEEVE RESECTION    . LAPAROSCOPIC GASTRIC BANDING      OB History   No obstetric history on file.      Home Medications    Prior to Admission medications   Medication Sig Start Date End Date Taking? Authorizing Provider  citalopram (CELEXA) 40 MG tablet Take 40 mg by mouth daily.   Yes [provider]  pantoprazole (PROTONIX) 40 MG tablet Take by mouth. 10/19/18  Yes [provider]  metFORMIN (GLUCOPHAGE-XR) 500 MG 24 hr tablet Take by mouth. 11/02/19 07/02/20  [provider]  citalopram (CELEXA) 40 MG tablet Take by mouth. 08/04/10   [provider]  doxycycline (VIBRAMYCIN) 100 MG capsule Take 1 capsule (100 mg total) by mouth 2 (two) times daily. 07/02/20   Margarette Canada, NP  fluticasone Asencion Islam) 50 MCG/ACT nasal spray Place into the nose.    [provider]  omeprazole (PRILOSEC) 20 MG capsule TAKE 2 TABLETS (40 MG TOTAL) BY MOUTH ONCE DAILY 10/28/17   [provider]  ondansetron (ZOFRAN-ODT) 4 MG disintegrating tablet Take 4 mg by mouth every 6 (six) hours as needed. 10/22/17   [provider]  promethazine (PHENERGAN) 12.5 MG tablet  11/09/17   [provider]    Family History Family History  Problem Relation Age of Onset  . Hypertension Mother   . Heart attack Father   . Congenital heart disease Father   . Hypertension Father   . Hyperlipidemia Father   . Breast cancer Maternal Grandmother     Social History Social History   Tobacco Use  . Smoking status: Former Smoker    Types: Cigarettes    Quit date: 10/21/2017    Years since quitting: 2.7  . Smokeless tobacco: Never Used  Vaping Use  . Vaping Use: Former  Substance Use Topics  . Alcohol use: Yes    Alcohol/week: 12.0 standard drinks    Types: 12 Cans of beer per week  . Drug use: No     Allergies   Benadryl [diphenhydramine], Diphenhydramine hcl, Shellfish allergy, and Shrimp extract allergy skin test   Review of Systems Review of Systems Per HPI  Physical Exam Triage Vital Signs ED Triage Vitals  Enc Vitals Group     BP 08/04/20 1248 (!) 154/92     Pulse Rate 08/04/20 1248 74     Resp 08/04/20 1248 14     Temp 08/04/20 1248 98.7 F (37.1 C)     Temp Source 08/04/20 1248 Oral     SpO2 08/04/20 1248 100 %     Weight 08/04/20 1244 280 lb (127 kg)     Height 08/04/20 1244  5\' 2"  (1.575 m)     Head Circumference --      Peak Flow --      Pain Score 08/04/20 1243 0     Pain Loc --      Pain Edu? --      Excl. in [place]? --    Updated Vital Signs BP (!) 154/92 (BP Location: Left Arm)   Pulse 74   Temp 98.7 F (37.1 C) (Oral)   Resp 14   Ht 5\' 2"  (1.575 m)   Wt 127 kg   SpO2 100%   BMI 51.21 kg/m   Visual Acuity Right Eye Distance:   Left Eye Distance:   Bilateral Distance:    Right Eye Near:   Left Eye Near:    Bilateral Near:     Physical Exam Constitutional:      General: She is not in acute distress.    Appearance: Normal appearance. She is obese. She is not ill-appearing.  HENT:     Head: Normocephalic and atraumatic.  Eyes:     General:        Right eye: No discharge.        Left eye: No discharge.      Conjunctiva/sclera: Conjunctivae normal.  Cardiovascular:     Rate and Rhythm: Normal rate and regular rhythm.     Heart sounds: No murmur heard.   Pulmonary:     Effort: Pulmonary effort is normal.     Breath sounds: Normal breath sounds. No wheezing, rhonchi or rales.  Neurological:     Mental Status: She is alert.  Psychiatric:        Mood and Affect: Mood normal.        Behavior: Behavior normal.    UC Treatments / Results  Labs (all labs ordered are listed, but only abnormal results are displayed) Labs Reviewed  SARS CORONAVIRUS 2 (TAT 6-24 HRS)    EKG   Radiology No results found.  Procedures Procedures (including critical care time)  Medications Ordered in UC Medications - No data to display  Initial Impression / Assessment and Plan / UC Course  I have reviewed the triage vital signs and the nursing notes.  Pertinent labs & imaging results that were available during my care of the patient were reviewed by me and considered in my medical decision making (see chart for details).    50 year old female presents with viral respiratory infection.  Suspected COVID-19.  Awaiting test results.  Advised supportive care with rest, fluids.  Work note given.  Final Clinical Impressions(s) / UC Diagnoses   Final diagnoses:  Viral respiratory infection  Suspected COVID-19 virus infection     Discharge Instructions     Rest. Lots of fluids.  Stay home.  Check my chart for COVID test results.  Take care  Dr. Lacinda Axon     ED Prescriptions    None     PDMP not reviewed this encounter.   Coral Spikes, DO 08/04/20 1344

## 2020-08-05 LAB — SARS CORONAVIRUS 2 (TAT 6-24 HRS): SARS Coronavirus 2: NEGATIVE

## 2020-10-23 ENCOUNTER — Other Ambulatory Visit: Payer: Self-pay | Admitting: Family Medicine

## 2020-10-23 DIAGNOSIS — Z1231 Encounter for screening mammogram for malignant neoplasm of breast: Secondary | ICD-10-CM

## 2020-10-24 ENCOUNTER — Other Ambulatory Visit: Payer: Self-pay

## 2020-10-24 ENCOUNTER — Ambulatory Visit
Admission: RE | Admit: 2020-10-24 | Discharge: 2020-10-24 | Disposition: A | Discharge status: Home / Self Care | Payer: BC Managed Care – PPO | Source: Ambulatory Visit | Attending: Family Medicine | Admitting: Family Medicine

## 2020-10-24 DIAGNOSIS — Z1231 Encounter for screening mammogram for malignant neoplasm of breast: Secondary | ICD-10-CM | POA: Insufficient documentation

## 2021-02-11 ENCOUNTER — Ambulatory Visit: Admit: 2021-02-11 | Payer: BC Managed Care – PPO

## 2021-03-18 ENCOUNTER — Other Ambulatory Visit: Payer: Self-pay

## 2021-03-18 ENCOUNTER — Encounter: Payer: Self-pay | Admitting: Emergency Medicine

## 2021-03-18 ENCOUNTER — Ambulatory Visit
Admission: EM | Admit: 2021-03-18 | Discharge: 2021-03-18 | Disposition: A | Discharge status: Home / Self Care | Payer: BC Managed Care – PPO | Attending: Family Medicine | Admitting: Family Medicine

## 2021-03-18 DIAGNOSIS — L259 Unspecified contact dermatitis, unspecified cause: Secondary | ICD-10-CM

## 2021-03-18 MED ORDER — HYDROXYZINE HCL 25 MG PO TABS: 25.0000 mg | ORAL_TABLET | Freq: Three times a day (TID) | ORAL | 0 refills | Status: AC | PRN

## 2021-03-18 MED ORDER — PREDNISONE 10 MG PO TABS: ORAL_TABLET | ORAL | 0 refills | Status: DC

## 2021-03-18 NOTE — Discharge Instructions (Signed)
Take the medications as prescribed.  If this continues to persist and does not improve, I recommend that she see a dermatologist.  Call your primary for referral.  Take care  Dr. Lacinda Axon

## 2021-03-18 NOTE — ED Provider Notes (Signed)
[facility]    CSN: [id number] Arrival date & time: 03/18/21  1644      History   Chief Complaint Chief Complaint  Patient presents with   Rash    HPI 50 year old female presents with rash.  16-monthhistory of rash of the lower extremities.  Patient reports that she thought that this was poison oak or poison ivy.  She has been treating herself with over-the-counter topicals without improvement.  She has a lot of itching and associated burning.  She has been scratching a lot.  No known exacerbating factors.  No other complaints.  Past Medical History:  Diagnosis Date   Depression    Depression    Obese    Past Surgical History:  Procedure Laterality Date   CHOLECYSTECTOMY     LAPAROSCOPIC GASTRIC BAND REMOVAL WITH LAPAROSCOPIC GASTRIC SLEEVE RESECTION     LAPAROSCOPIC GASTRIC BANDING      Home Medications    Prior to Admission medications   Medication Sig Start Date End Date Taking? Authorizing Provider  citalopram (CELEXA) 40 MG tablet Take 40 mg by mouth daily.   Yes [provider]  fluticasone (FLONASE) 50 MCG/ACT nasal spray Place into the nose.   Yes [provider]  hydrOXYzine (ATARAX/VISTARIL) 25 MG tablet Take 1 tablet (25 mg total) by mouth every 8 (eight) hours as needed for itching. 03/18/21  Yes Jaevian Shean G, DO  pantoprazole (PROTONIX) 40 MG tablet Take by mouth. 10/19/18  Yes [provider]  predniSONE (DELTASONE) 10 MG tablet 50 mg daily x 3 days, then 40 mg daily x 3 days, then 30 mg daily x 3 days, then 20 mg daily x 3 days, then 10 mg daily x 3 days. 03/18/21  Yes CCoral Spikes DO  metFORMIN (GLUCOPHAGE-XR) 500 MG 24 hr tablet Take by mouth. 11/02/19 07/02/20  [provider]  citalopram (CELEXA) 40 MG tablet Take by mouth. 08/04/10   [provider]  ondansetron (ZOFRAN-ODT) 4 MG disintegrating tablet Take 4 mg by mouth every 6 (six) hours as needed. 10/22/17   [provider]   promethazine (PHENERGAN) 12.5 MG tablet  11/09/17   [provider]    Family History Family History  Problem Relation Age of Onset   Hypertension Mother    Heart attack Father    Congenital heart disease Father    Hypertension Father    Hyperlipidemia Father    Breast cancer Maternal Grandmother     Social History Social History   Tobacco Use   Smoking status: Former    Types: Cigarettes    Quit date: 10/21/2017    Years since quitting: 3.4   Smokeless tobacco: Never  Vaping Use   Vaping Use: Former  Substance Use Topics   Alcohol use: Yes    Alcohol/week: 12.0 standard drinks    Types: 12 Cans of beer per week   Drug use: No     Allergies   Benadryl [diphenhydramine], Diphenhydramine hcl, Shellfish allergy, and Shrimp extract allergy skin test   Review of Systems Review of Systems  Constitutional: Negative.   Skin:  Positive for rash.   Physical Exam Triage Vital Signs ED Triage Vitals  Enc Vitals Group     BP 03/18/21 1709 140/90     Pulse Rate 03/18/21 1709 77     Resp 03/18/21 1709 18     Temp 03/18/21 1709 98.6 F (37 C)     Temp Source 03/18/21 1709 Oral  SpO2 03/18/21 1709 99 %     Weight 03/18/21 1705 279 lb 15.8 oz (127 kg)     Height 03/18/21 1705 '5\' 2"'$  (1.575 m)     Head Circumference --      Peak Flow --      Pain Score 03/18/21 1704 5     Pain Loc --      Pain Edu? --      Excl. in [place]? --    Updated Vital Signs BP 140/90 (BP Location: Left Arm)   Pulse 77   Temp 98.6 F (37 C) (Oral)   Resp 18   Ht '5\' 2"'$  (1.575 m)   Wt 127 kg   SpO2 99%   BMI 51.21 kg/m   Visual Acuity Right Eye Distance:   Left Eye Distance:   Bilateral Distance:    Right Eye Near:   Left Eye Near:    Bilateral Near:     Physical Exam Vitals and nursing note reviewed.  Constitutional:      General: She is not in acute distress.    Appearance: Normal appearance. She is not ill-appearing.  HENT:     Head: Normocephalic and atraumatic.   Eyes:     General:        Right eye: No discharge.        Left eye: No discharge.     Conjunctiva/sclera: Conjunctivae normal.  Cardiovascular:     Rate and Rhythm: Normal rate and regular rhythm.  Pulmonary:     Effort: Pulmonary effort is normal. No respiratory distress.  Skin:    Comments: Lower extremities with raised, erythematous rash with extensive excoriation.  Neurological:     Mental Status: She is alert.  Psychiatric:        Mood and Affect: Mood normal.        Behavior: Behavior normal.     UC Treatments / Results  Labs (all labs ordered are listed, but only abnormal results are displayed) Labs Reviewed - No data to display  EKG   Radiology No results found.  Procedures Procedures (including critical care time)  Medications Ordered in UC Medications - No data to display  Initial Impression / Assessment and Plan / UC Course  I have reviewed the triage vital signs and the nursing notes.  Pertinent labs & imaging results that were available during my care of the patient were reviewed by me and considered in my medical decision making (see chart for details).    50 year old female presents with rash.  Suspect contact dermatitis.  Treating with prednisone and Atarax.  Final Clinical Impressions(s) / UC Diagnoses   Final diagnoses:  Contact dermatitis, unspecified contact dermatitis type, unspecified trigger     Discharge Instructions      Take the medications as prescribed.  If this continues to persist and does not improve, I recommend that she see a dermatologist.  Call your primary for referral.  Take care  Dr. Lacinda Axon    ED Prescriptions     Medication Sig Bates. Provider   predniSONE (DELTASONE) 10 MG tablet 50 mg daily x 3 days, then 40 mg daily x 3 days, then 30 mg daily x 3 days, then 20 mg daily x 3 days, then 10 mg daily x 3 days. 45 tablet Tiffay Pinette G, DO   hydrOXYzine (ATARAX/VISTARIL) 25 MG tablet Take 1 tablet (25 mg  total) by mouth every 8 (eight) hours as needed for itching. 30 tablet [place], [place], [place]  PDMP not reviewed this encounter.   Coral Spikes, [place] 03/18/21 1737

## 2021-03-18 NOTE — ED Triage Notes (Signed)
Pt c/o rash on bilateral lower legs. Started about 2 months ago. She states she has been using otc creams but has not gotten better. She thought it was some type of poison oak. Pt states rash is itchy and burns. Rash is small red spots with some scabbing.

## 2021-05-13 IMAGING — MG MM DIGITAL SCREENING BILAT W/ TOMO AND CAD
8 series · 8 of 24 positions shown · non-contrast
Comparison: Previous exam(s).

CLINICAL DATA: Screening.

EXAM:
DIGITAL SCREENING BILATERAL MAMMOGRAM WITH TOMOSYNTHESIS AND CAD
TECHNIQUE: Bilateral screening digital craniocaudal and mediolateral oblique
mammograms were obtained. Bilateral screening digital breast
tomosynthesis was performed. The images were evaluated with
computer-aided detection.

[L MLO synth-2D]
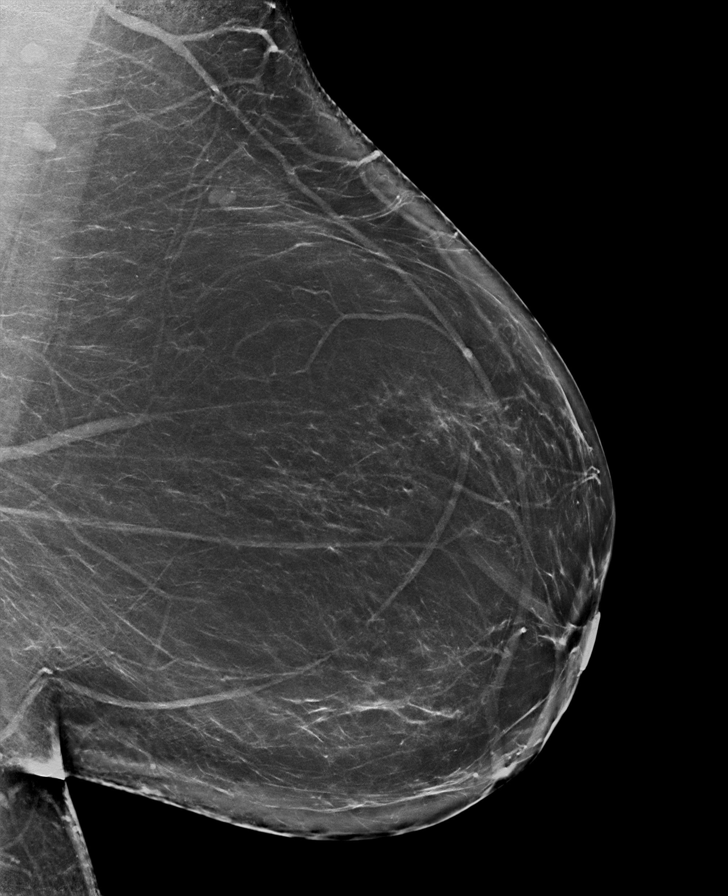

[L CC synth-2D]
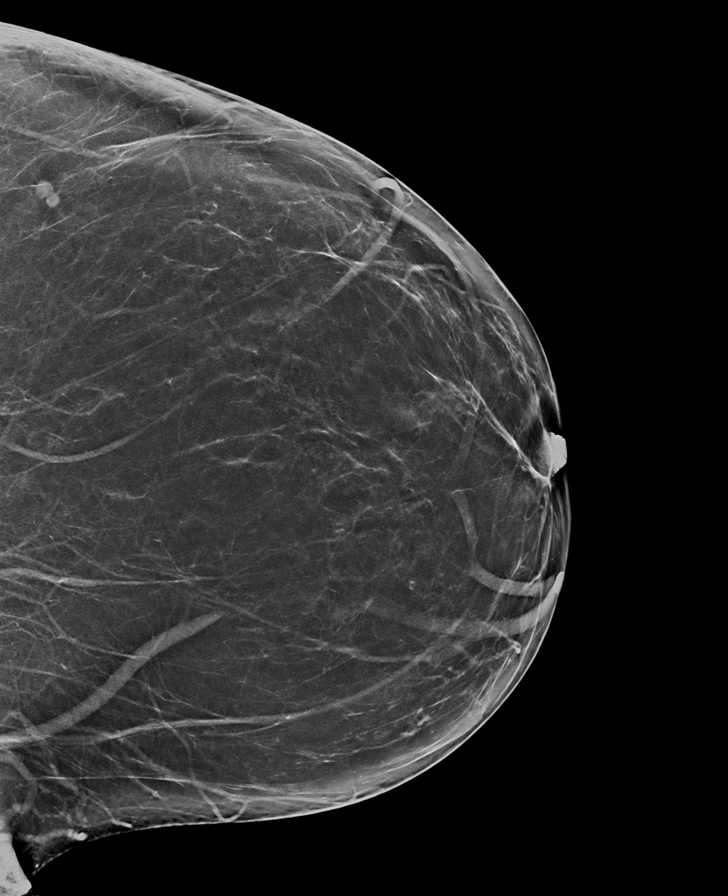

[R MLO synth-2D]
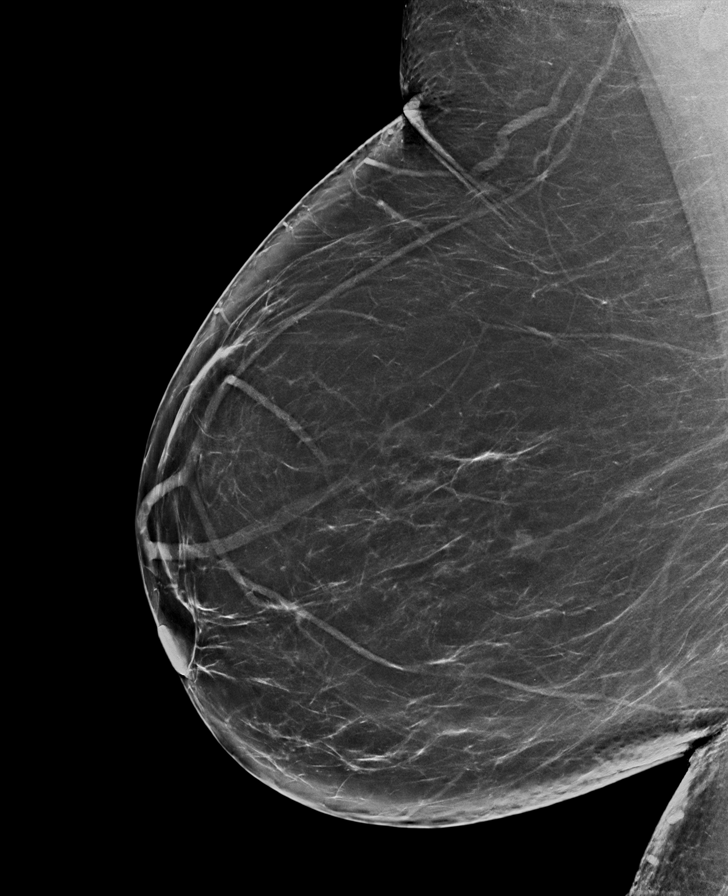

[R CC synth-2D]
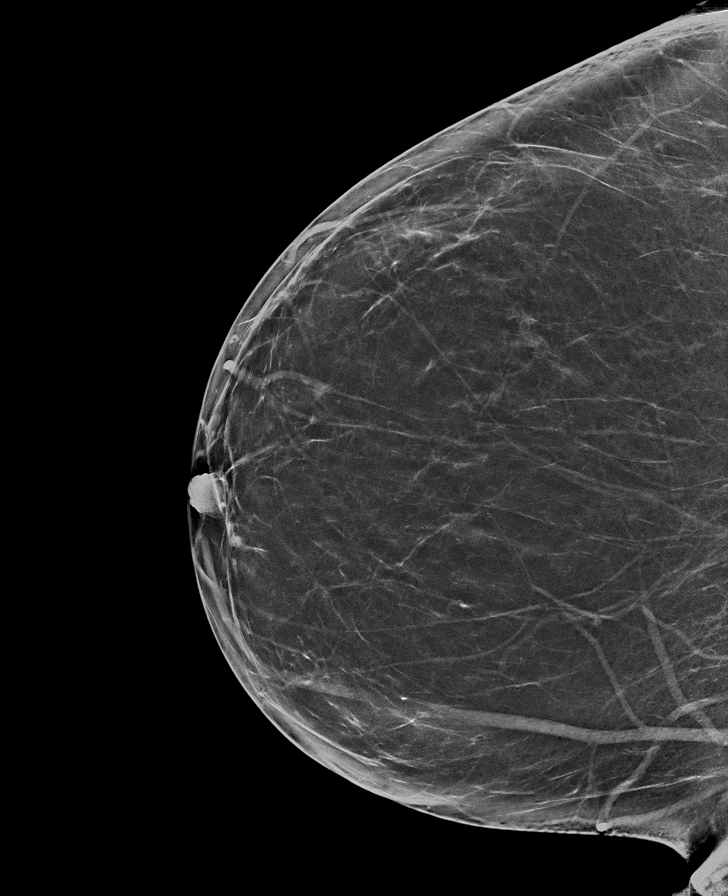

[R MLO tomo · tomo slice 45/89.0]
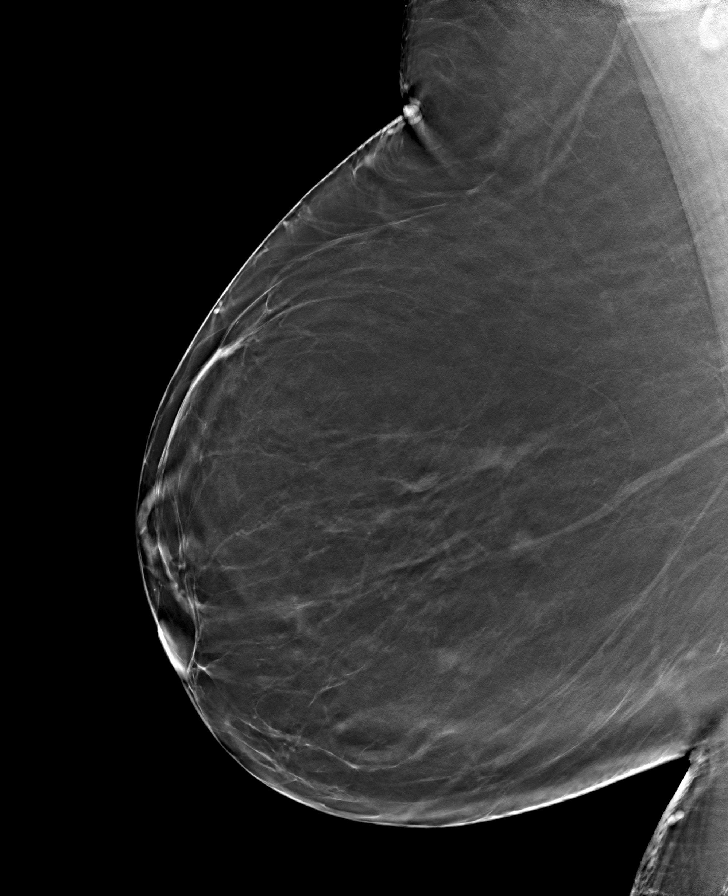

[L MLO tomo · tomo slice 47/93.0]
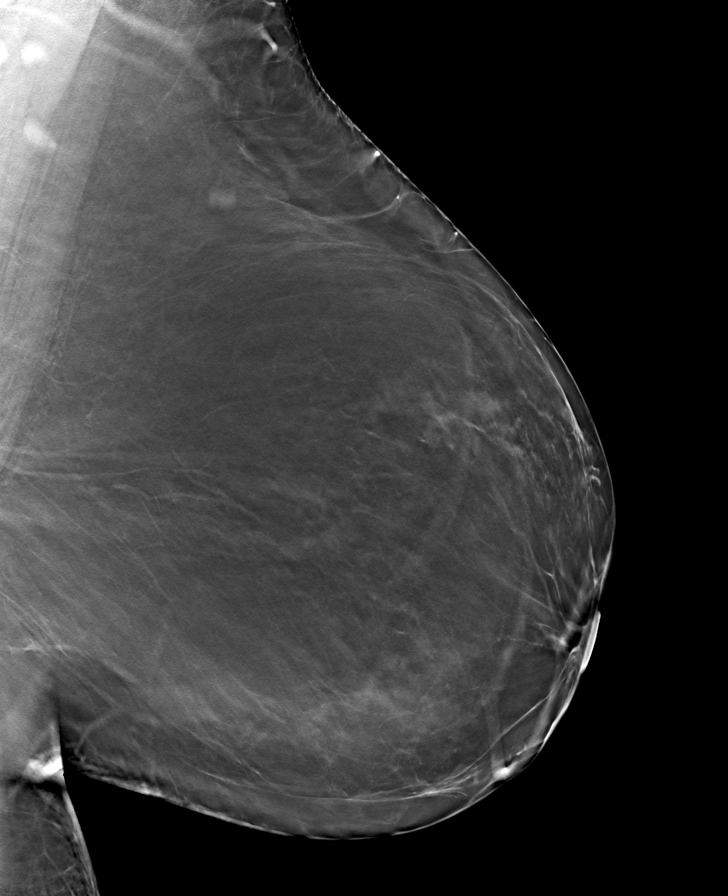

[R CC tomo · tomo slice 41/81.0]
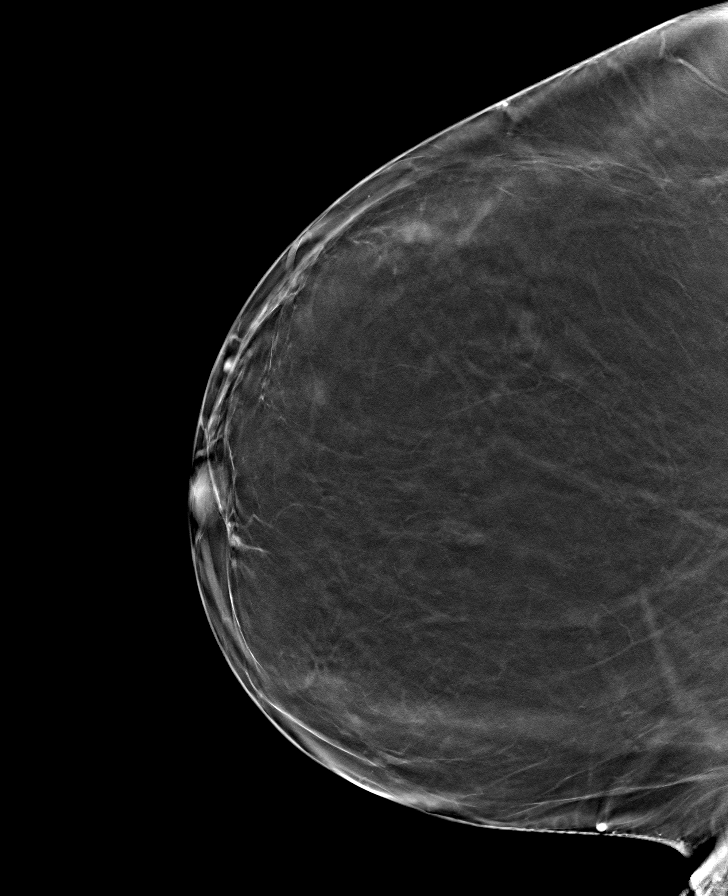

[L CC tomo · tomo slice 41/81.0]
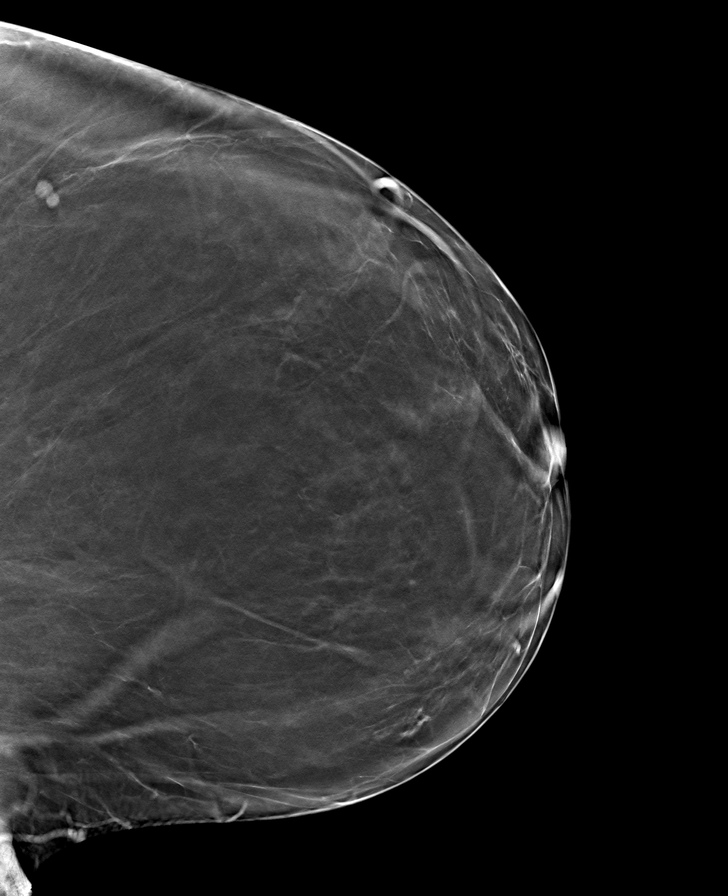

[8 of 24 positions shown; findings below may reference images not displayed]

ACR Breast Density Category b: There are scattered areas of
fibroglandular density.
FINDINGS: There are no findings suspicious for malignancy. The images were
evaluated with computer-aided detection.
IMPRESSION: No mammographic evidence of malignancy. A result letter of this
screening mammogram will be mailed directly to the patient.

RECOMMENDATION:
Screening mammogram in one year. (Code:WJ-I-BG6)

BI-RADS CATEGORY  1: Negative.

## 2021-05-26 ENCOUNTER — Encounter: Payer: Self-pay | Admitting: *Deleted

## 2021-05-27 ENCOUNTER — Ambulatory Visit: Payer: BC Managed Care – PPO | Admitting: Anesthesiology

## 2021-05-27 ENCOUNTER — Ambulatory Visit
Admission: RE | Admit: 2021-05-27 | Discharge: 2021-05-27 | Disposition: A | Discharge status: Home / Self Care | Payer: BC Managed Care – PPO | Attending: Gastroenterology | Admitting: Gastroenterology

## 2021-05-27 ENCOUNTER — Encounter
Admission: RE | Disposition: A | Discharge status: Home / Self Care | Payer: Self-pay | Source: Home / Self Care | Attending: Gastroenterology

## 2021-05-27 ENCOUNTER — Encounter: Payer: Self-pay | Admitting: *Deleted

## 2021-05-27 ENCOUNTER — Other Ambulatory Visit: Payer: Self-pay

## 2021-05-27 DIAGNOSIS — K640 First degree hemorrhoids: Secondary | ICD-10-CM | POA: Insufficient documentation

## 2021-05-27 DIAGNOSIS — K625 Hemorrhage of anus and rectum: Secondary | ICD-10-CM | POA: Insufficient documentation

## 2021-05-27 DIAGNOSIS — K621 Rectal polyp: Secondary | ICD-10-CM | POA: Insufficient documentation

## 2021-05-27 DIAGNOSIS — R1013 Epigastric pain: Secondary | ICD-10-CM | POA: Diagnosis not present

## 2021-05-27 DIAGNOSIS — K5730 Diverticulosis of large intestine without perforation or abscess without bleeding: Secondary | ICD-10-CM | POA: Diagnosis not present

## 2021-05-27 DIAGNOSIS — R1031 Right lower quadrant pain: Secondary | ICD-10-CM | POA: Insufficient documentation

## 2021-05-27 DIAGNOSIS — K449 Diaphragmatic hernia without obstruction or gangrene: Secondary | ICD-10-CM | POA: Diagnosis not present

## 2021-05-27 HISTORY — DX: Female infertility, unspecified: N97.9

## 2021-05-27 HISTORY — DX: Perforation of intestine (nontraumatic): K63.1

## 2021-05-27 HISTORY — PX: COLONOSCOPY: SHX5424

## 2021-05-27 HISTORY — DX: Cardiac murmur, unspecified: R01.1

## 2021-05-27 HISTORY — DX: Ileus, unspecified: K56.7

## 2021-05-27 HISTORY — DX: Essential (primary) hypertension: I10

## 2021-05-27 HISTORY — DX: Anemia, unspecified: D64.9

## 2021-05-27 HISTORY — DX: Benign neoplasm of connective and other soft tissue, unspecified: D21.9

## 2021-05-27 HISTORY — DX: Allergic rhinitis, unspecified: J30.9

## 2021-05-27 HISTORY — DX: Cardiac arrhythmia, unspecified: I49.9

## 2021-05-27 HISTORY — DX: Gastro-esophageal reflux disease without esophagitis: K21.9

## 2021-05-27 HISTORY — PX: ESOPHAGOGASTRODUODENOSCOPY: SHX5428

## 2021-05-27 HISTORY — DX: Sleep apnea, unspecified: G47.30

## 2021-05-27 LAB — POCT PREGNANCY, URINE: Preg Test, Ur: NEGATIVE

## 2021-05-27 SURGERY — EGD (ESOPHAGOGASTRODUODENOSCOPY)
Anesthesia: General

## 2021-05-27 MED ORDER — PROPOFOL 500 MG/50ML IV EMUL
INTRAVENOUS | Status: DC | PRN
  Administered 2021-05-27: 175 ug/kg/min via INTRAVENOUS

## 2021-05-27 MED ORDER — DEXMEDETOMIDINE (PRECEDEX) IN NS 20 MCG/5ML (4 MCG/ML) IV SYRINGE
PREFILLED_SYRINGE | INTRAVENOUS | Status: DC | PRN
  Administered 2021-05-27: 8 ug via INTRAVENOUS

## 2021-05-27 MED ORDER — LIDOCAINE HCL (CARDIAC) PF 100 MG/5ML IV SOSY
PREFILLED_SYRINGE | INTRAVENOUS | Status: DC | PRN
  Administered 2021-05-27: 50 mg via INTRAVENOUS

## 2021-05-27 MED ORDER — PHENYLEPHRINE HCL (PRESSORS) 10 MG/ML IV SOLN
INTRAVENOUS | Status: DC | PRN
Start: 2021-05-27 — End: 2021-05-27
  Administered 2021-05-27: 160 ug via INTRAVENOUS

## 2021-05-27 MED ORDER — SODIUM CHLORIDE 0.9 % IV SOLN: INTRAVENOUS | Status: DC

## 2021-05-27 MED ORDER — PROPOFOL 10 MG/ML IV BOLUS
INTRAVENOUS | Status: DC | PRN
  Administered 2021-05-27: 70 mg via INTRAVENOUS

## 2021-05-27 MED ORDER — STERILE WATER FOR IRRIGATION IR SOLN
Status: DC | PRN
  Administered 2021-05-27: 60 mL

## 2021-05-27 NOTE — Op Note (Signed)
[facility] Gastroenterology Patient Name: Bianca Mercer Procedure Date: 05/27/2021 12:40 PM MRN: [id number] Account #: [id number] Date of Birth: Feb 27, 1971 Admit Type: Outpatient Age: 50 Room: [facility] ENDO ROOM 1 Gender: Female Note Status: Finalized Instrument Name: Upper Endoscope [id number] Procedure:             Upper GI endoscopy Indications:           Epigastric abdominal pain Providers:             Andrey Farmer MD, MD Referring MD:          Sofie Hartigan (Referring MD) Medicines:             Monitored Anesthesia Care Complications:         No immediate complications. Procedure:             Pre-Anesthesia Assessment:                        - Prior to the procedure, a History and Physical was                         performed, and patient medications and allergies were                         reviewed. The patient is competent. The risks and                         benefits of the procedure and the sedation options and                         risks were discussed with the patient. All questions                         were answered and informed consent was obtained.                         Patient identification and proposed procedure were                         verified by the physician, the nurse, the anesthetist                         and the technician in the endoscopy suite. Mental                         Status Examination: alert and oriented. Airway                         Examination: normal oropharyngeal airway and neck                         mobility. Respiratory Examination: clear to                         auscultation. CV Examination: normal. Prophylactic                         Antibiotics: The patient does not require prophylactic  antibiotics. Prior Anticoagulants: The patient has                         taken no previous anticoagulant or antiplatelet                         agents. ASA Grade Assessment: III - A  patient with                         severe systemic disease. After reviewing the risks and                         benefits, the patient was deemed in satisfactory                         condition to undergo the procedure. The anesthesia                         plan was to use monitored anesthesia care (MAC).                         Immediately prior to administration of medications,                         the patient was re-assessed for adequacy to receive                         sedatives. The heart rate, respiratory rate, oxygen                         saturations, blood pressure, adequacy of pulmonary                         ventilation, and response to care were monitored                         throughout the procedure. The physical status of the                         patient was re-assessed after the procedure.                        After obtaining informed consent, the endoscope was                         passed under direct vision. Throughout the procedure,                         the patient's blood pressure, pulse, and oxygen                         saturations were monitored continuously. The Endoscope                         was introduced through the mouth, and advanced to the                         second part of duodenum. The upper GI endoscopy was  technically difficult and complex due to the patient's                         respiratory instability (hypoxia). Successful                         completion of the procedure was aided by managing the                         patient's medical instability. The patient tolerated                         the procedure well. Findings:      A small hiatal hernia was present.      The exam of the esophagus was otherwise normal.      The entire examined stomach was normal.      The examined duodenum was normal. Impression:            - Small hiatal hernia.                        - Normal stomach.                         - Normal examined duodenum.                        - No specimens collected. Recommendation:        - Perform a colonoscopy today. Procedure Code(s):     --- Professional ---                        [phone], Esophagogastroduodenoscopy, flexible,                         transoral; diagnostic, including collection of                         specimen(s) by brushing or washing, when performed                         (separate procedure) Diagnosis Code(s):     --- Professional ---                        K44.9, Diaphragmatic hernia without obstruction or                         gangrene                        R10.13, Epigastric pain CPT copyright 2019 American Medical Association. All rights reserved. The codes documented in this report are preliminary and upon coder review may  be revised to meet current compliance requirements. Andrey Farmer MD, MD 05/27/2021 1:34:11 PM Number of Addenda: 0 Note Initiated On: 05/27/2021 12:40 PM Scope Withdrawal Time: 0 hours 10 minutes 34 seconds  Total Procedure Duration: 0 hours 15 minutes 2 seconds  Estimated Blood Loss:  Estimated blood loss: none.      [facility]

## 2021-05-27 NOTE — Transfer of Care (Signed)
Immediate Anesthesia Transfer of Care Note  Patient: ELISABET GUTZMER  Procedure(s) Performed: ESOPHAGOGASTRODUODENOSCOPY (EGD) COLONOSCOPY  Patient Location: PACU  Anesthesia Type:General  Level of Consciousness: awake, alert  and oriented  Airway & Oxygen Therapy: Patient Spontanous Breathing  Post-op Assessment: Report given to RN and Post -op Vital signs reviewed and stable  Post vital signs: Reviewed and stable  Last Vitals:  Vitals Value Taken Time  BP 144/76 05/27/21 1328  Temp    Pulse 70 05/27/21 1328  Resp 12 05/27/21 1328  SpO2 97 % 05/27/21 1328    Last Pain:  Vitals:   05/27/21 1213  TempSrc: Temporal  PainSc: 0-No pain         Complications: No notable events documented.

## 2021-05-27 NOTE — Op Note (Signed)
[facility] Gastroenterology Patient Name: Bianca Mercer Procedure Date: 05/27/2021 12:39 PM MRN: [id number] Account #: [id number] Date of Birth: March 09, 1971 Admit Type: Outpatient Age: 50 Room: [facility] ENDO ROOM 1 Gender: Female Note Status: Finalized Instrument Name: Jasper Riling [id number] Procedure:             Colonoscopy Indications:           Abdominal pain in the right lower quadrant, Rectal                         bleeding Providers:             Andrey Farmer MD, MD Referring MD:          Sofie Hartigan (Referring MD) Medicines:             Monitored Anesthesia Care Complications:         No immediate complications. Estimated blood loss:                         Minimal. Procedure:             Pre-Anesthesia Assessment:                        - Prior to the procedure, a History and Physical was                         performed, and patient medications and allergies were                         reviewed. The patient is competent. The risks and                         benefits of the procedure and the sedation options and                         risks were discussed with the patient. All questions                         were answered and informed consent was obtained.                         Patient identification and proposed procedure were                         verified by the physician, the nurse, the anesthetist                         and the technician in the endoscopy suite. Mental                         Status Examination: alert and oriented. Airway                         Examination: normal oropharyngeal airway and neck                         mobility. Respiratory Examination: clear to  auscultation. CV Examination: normal. Prophylactic                         Antibiotics: The patient does not require prophylactic                         antibiotics. Prior Anticoagulants: The patient has                         taken no  previous anticoagulant or antiplatelet                         agents. ASA Grade Assessment: III - A patient with                         severe systemic disease. After reviewing the risks and                         benefits, the patient was deemed in satisfactory                         condition to undergo the procedure. The anesthesia                         plan was to use monitored anesthesia care (MAC).                         Immediately prior to administration of medications,                         the patient was re-assessed for adequacy to receive                         sedatives. The heart rate, respiratory rate, oxygen                         saturations, blood pressure, adequacy of pulmonary                         ventilation, and response to care were monitored                         throughout the procedure. The physical status of the                         patient was re-assessed after the procedure.                        After obtaining informed consent, the colonoscope was                         passed under direct vision. Throughout the procedure,                         the patient's blood pressure, pulse, and oxygen                         saturations were monitored continuously. The  Colonoscope was introduced through the anus and                         advanced to the the cecum, identified by appendiceal                         orifice and ileocecal valve. The colonoscopy was                         performed without difficulty. The patient tolerated                         the procedure well. The quality of the bowel                         preparation was adequate to identify polyps. Findings:      The perianal and digital rectal examinations were normal.      A few small-mouthed diverticula were found in the sigmoid colon.      A 1 mm polyp was found in the rectum. The polyp was sessile. The polyp       was removed with a jumbo cold  forceps. Resection and retrieval were       complete. Estimated blood loss was minimal.      Internal hemorrhoids were found during retroflexion. The hemorrhoids       were Grade I (internal hemorrhoids that do not prolapse).      The exam was otherwise without abnormality on direct and retroflexion       views. Impression:            - Diverticulosis in the sigmoid colon.                        - One 1 mm polyp in the rectum, removed with a jumbo                         cold forceps. Resected and retrieved.                        - Internal hemorrhoids.                        - The examination was otherwise normal on direct and                         retroflexion views. Recommendation:        - Discharge patient to home.                        - Resume previous diet.                        - Continue present medications.                        - Await pathology results.                        - Repeat colonoscopy date to be determined after  pending pathology results are reviewed for                         surveillance.                        - Return to referring physician as previously                         scheduled. Please see EGD report for pictures from                         colonoscopy. Procedure Code(s):     --- Professional ---                        [phone], Colonoscopy, flexible; with biopsy, single or                         multiple Diagnosis Code(s):     --- Professional ---                        K64.0, First degree hemorrhoids                        K62.1, Rectal polyp                        R10.31, Right lower quadrant pain                        K62.5, Hemorrhage of anus and rectum                        K57.30, Diverticulosis of large intestine without                         perforation or abscess without bleeding CPT copyright 2019 American Medical Association. All rights reserved. The codes documented in this report are preliminary  and upon coder review may  be revised to meet current compliance requirements. Andrey Farmer MD, MD 05/27/2021 1:36:35 PM Number of Addenda: 0 Note Initiated On: 05/27/2021 12:39 PM Estimated Blood Loss:  Estimated blood loss was minimal.      [facility]

## 2021-05-27 NOTE — H&P (Signed)
Outpatient short stay form Pre-procedure 05/27/2021  [name], MD  Primary Physician: Sofie Hartigan, MD  Reason for visit:  Epigastric pain and rectal bleeding  History of present illness:   50 y/o lady with history of obesity here for EGD for history of epigastric pain and colonoscopy for right lower quadrant pain and rectal bleeding. No blood thinners. No family history of GI malignancies. 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    Current Facility-Administered Medications:    0.9 %  sodium chloride infusion, , Intravenous, Continuous, Kebrina Friend, Hilton Cork, MD, Last Rate: 20 mL/hr at 05/27/21 1214, Restarted at 05/27/21 1226  Medications Prior to Admission  Medication Sig Dispense Refill Last Dose   citalopram (CELEXA) 40 MG tablet Take 40 mg by mouth daily.   05/26/2021   pantoprazole (PROTONIX) 40 MG tablet Take by mouth.   05/26/2021   citalopram (CELEXA) 40 MG tablet Take by mouth.      fluticasone (FLONASE) 50 MCG/ACT nasal spray Place into the nose.      hydrOXYzine (ATARAX/VISTARIL) 25 MG tablet Take 1 tablet (25 mg total) by mouth every 8 (eight) hours as needed for itching. 30 tablet 0    ondansetron (ZOFRAN-ODT) 4 MG disintegrating tablet Take 4 mg by mouth every 6 (six) hours as needed.  0    predniSONE (DELTASONE) 10 MG tablet 50 mg daily x 3 days, then 40 mg daily x 3 days, then 30 mg daily x 3 days, then 20 mg daily x 3 days, then 10 mg daily x 3 days. 45 tablet 0    promethazine (PHENERGAN) 12.5 MG tablet         Allergies  Allergen Reactions   Benadryl [Diphenhydramine] Other (See Comments)    Hyper activity   Diphenhydramine Hcl    Shellfish Allergy Nausea And Vomiting   Shrimp Extract Allergy Skin Test Nausea And Vomiting     Past Medical History:  Diagnosis Date   Allergic rhinitis    Anemia    Bowel perforation (HCC)    Depression    Depression    Dysrhythmia    Fibroid     GERD (gastroesophageal reflux disease)    Heart murmur    Hypertension    Ileus (HCC)    Infertility, female    Obese    Sleep apnea     Review of systems:  Otherwise negative.    Physical Exam  Gen: Alert, oriented. Appears stated age.  HEENT: PERRLA. Lungs: No respiratory distress Abd: soft, benign, no masses Ext: No edema    Planned procedures: Proceed with EGD/colonoscopy. The patient understands the nature of the planned procedure, indications, risks, alternatives and potential complications including but not limited to bleeding, infection, perforation, damage to internal organs and possible oversedation/side effects from anesthesia. The patient agrees and gives consent to proceed.  Please refer to procedure notes for findings, recommendations and patient disposition/instructions.     [name], MD [facility] Gastroenterology

## 2021-05-27 NOTE — Anesthesia Postprocedure Evaluation (Signed)
Anesthesia Post Note  Patient: Bianca Mercer  Procedure(s) Performed: ESOPHAGOGASTRODUODENOSCOPY (EGD) COLONOSCOPY  Patient location during evaluation: Phase II Anesthesia Type: General Level of consciousness: awake and alert, awake and oriented Pain management: pain level controlled Vital Signs Assessment: post-procedure vital signs reviewed and stable Respiratory status: spontaneous breathing, nonlabored ventilation and respiratory function stable Cardiovascular status: blood pressure returned to baseline and stable Postop Assessment: no apparent nausea or vomiting Anesthetic complications: no   No notable events documented.   Last Vitals:  Vitals:   05/27/21 1338 05/27/21 1348  BP: (!) 164/96 (!) 162/94  Pulse: 63 61  Resp: 11 11  Temp:    SpO2: 99% 100%    Last Pain:  Vitals:   05/27/21 1348  TempSrc:   PainSc: 0-No pain                 Phill Mutter

## 2021-05-27 NOTE — Anesthesia Preprocedure Evaluation (Signed)
Anesthesia Evaluation  Patient identified by MRN, date of birth, ID band Patient awake    Reviewed: Allergy & Precautions, H&P , NPO status , Patient's Chart, lab work & pertinent test results  Airway Mallampati: II  TM Distance: >3 FB Neck ROM: Full    Dental no notable dental hx. (+) Dental Advisory Given   Pulmonary sleep apnea (Intolerant of CPAP) , Patient abstained from smoking., former smoker,    Pulmonary exam normal        Cardiovascular hypertension, Normal cardiovascular exam+ dysrhythmias + Valvular Problems/Murmurs      Neuro/Psych PSYCHIATRIC DISORDERS Depression negative neurological ROS     GI/Hepatic Neg liver ROS, Bowel prep,GERD  Medicated,  Endo/Other  Morbid obesity  Renal/GU negative Renal ROS  negative genitourinary   Musculoskeletal negative musculoskeletal ROS (+)   Abdominal   Peds negative pediatric ROS (+)  Hematology negative hematology ROS (+) anemia ,   Anesthesia Other Findings   Reproductive/Obstetrics negative OB ROS                            Anesthesia Physical Anesthesia Plan  ASA: 3  Anesthesia Plan: General   Post-op Pain Management:    Induction: Intravenous  PONV Risk Score and Plan: 2 and Propofol infusion and TIVA  Airway Management Planned: Natural Airway and Nasal Cannula  Additional Equipment:   Intra-op Plan:   Post-operative Plan:   Informed Consent: I have reviewed the patients History and Physical, chart, labs and discussed the procedure including the risks, benefits and alternatives for the proposed anesthesia with the patient or authorized representative who has indicated his/her understanding and acceptance.       Plan Discussed with: CRNA, Surgeon and Anesthesiologist  Anesthesia Plan Comments:         Anesthesia Quick Evaluation

## 2021-05-27 NOTE — Interval H&P Note (Signed)
History and Physical Interval Note:  05/27/2021 12:40 PM  Bianca Mercer  has presented today for surgery, with the diagnosis of EPIG PAIN RECTAL BLEEDING IRREG BH RL QUADRANT PAIN.  The various methods of treatment have been discussed with the patient and family. After consideration of risks, benefits and other options for treatment, the patient has consented to  Procedure(s): ESOPHAGOGASTRODUODENOSCOPY (EGD) (N/A) COLONOSCOPY (N/A) as a surgical intervention.  The patient's history has been reviewed, patient examined, no change in status, stable for surgery.  I have reviewed the patient's chart and labs.  Questions were answered to the patient's satisfaction.     [name]  Ok to proceed with EGD/Colonoscopy

## 2021-05-27 NOTE — Anesthesia Procedure Notes (Signed)
Date/Time: 05/27/2021 12:45 PM Performed by: Johnna Acosta, CRNA Pre-anesthesia Checklist: Patient identified, Emergency Drugs available, Suction available, Patient being monitored and Timeout performed Patient Re-evaluated:Patient Re-evaluated prior to induction Oxygen Delivery Method: Nasal cannula Preoxygenation: Pre-oxygenation with 100% oxygen Induction Type: IV induction

## 2021-05-28 ENCOUNTER — Encounter: Payer: Self-pay | Admitting: Gastroenterology

## 2021-05-28 LAB — SURGICAL PATHOLOGY

## 2021-07-19 ENCOUNTER — Ambulatory Visit
Admission: EM | Admit: 2021-07-19 | Discharge: 2021-07-19 | Disposition: A | Discharge status: Home / Self Care | Payer: BC Managed Care – PPO | Attending: Emergency Medicine | Admitting: Emergency Medicine

## 2021-07-19 ENCOUNTER — Other Ambulatory Visit: Payer: Self-pay

## 2021-07-19 DIAGNOSIS — I10 Essential (primary) hypertension: Secondary | ICD-10-CM | POA: Insufficient documentation

## 2021-07-19 LAB — BASIC METABOLIC PANEL
Anion gap: 7 (ref 5–15)
BUN: 10 mg/dL (ref 6–20)
CO2: 26 mmol/L (ref 22–32)
Calcium: 9.4 mg/dL (ref 8.9–10.3)
Chloride: 103 mmol/L (ref 98–111)
Creatinine, Ser: 0.81 mg/dL (ref 0.44–1.00)
GFR, Estimated: >60 mL/min (ref >60)
Glucose, Bld: 97 mg/dL (ref 70–99)
Potassium: 4.3 mmol/L (ref 3.5–5.1)
Sodium: 136 mmol/L (ref 135–145)

## 2021-07-19 MED ORDER — HYDROCHLOROTHIAZIDE 12.5 MG PO TABS: 12.5000 mg | ORAL_TABLET | Freq: Every day | ORAL | 0 refills | Status: AC

## 2021-07-19 NOTE — Discharge Instructions (Addendum)
I will contact you if your lab work comes back abnormal.  Decrease your salt intake. diet and exercise will lower your blood pressure significantly. It is important to keep your blood pressure under good control, as having a elevated blood pressure for prolonged periods of time significantly increases your risk of stroke, heart attacks, kidney damage, eye damage, and other problems. Measure your blood pressure twice a day, preferably at the same time every day, once in the morning and once at night. Keep a log of this and bring it to your next doctor's appointment.  Bring your blood pressure cuff as well.   Return immediately to the ER if you start having chest pain, headache, problems seeing, problems talking, problems walking, if you feel like you're about to pass out, if you do pass out, if you have a seizure, or for any other concerns.  Go to www.goodrx.com  or www.costplusdrugs.com to look up your medications. This will give you a list of where you can find your prescriptions at the most affordable prices. Or ask the pharmacist what the cash price is, or if they have any other discount programs available to help make your medication more affordable. This can be less expensive than what you would pay with insurance.

## 2021-07-19 NOTE — ED Provider Notes (Signed)
HPI  SUBJECTIVE:  Bianca Mercer is a 51 y.o. female who presents with elevated blood pressure readings at home over the past several months.  She states it usually ranges in the 170s/110s.  States that she is not sure if her home blood pressure monitoring device is accurate.  She states that she is hypertensive primarily at the end of the day.  She has checked her blood pressure with multiple different devices and it has been consistently elevated, especially in the evenings.  She also reports palpitations.  No headache, blurry vision, double vision, arm or leg weakness, slurred speech, chest pain, pressure, hematuria, shortness of breath, tearing pain going through to her back, seizures, syncope, anuria, hematuria.  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  She does not get the lower extremity edema on the weekends.  She states that the edema resolves on its own overnight.  She drinks 1 large coffee per day.  No recent change in medications.  No weight loss supplements, herbal supplements, decongestant use.  No unintentional weight loss, heat intolerance.  Past medical history of GERD, murmur.  No history of coronary disease, MI, hypercholesterolemia, thyroid disease.  Patient has a diagnosis of hypertension on chart review, patient denies a formal diagnosis of this, and states that she has never been treated with medications for this.  LMP: 1 year ago.  States that she has been evaluated by OB/GYN for this.  Past Medical History:  Diagnosis Date   Allergic rhinitis    Anemia    Bowel perforation (HCC)    Depression    Depression    Dysrhythmia    Fibroid    GERD (gastroesophageal reflux disease)    Heart murmur    Hypertension    Ileus ([place])    Infertility, female    Obese    Sleep apnea     Past Surgical History:  Procedure Laterality Date   CHOLECYSTECTOMY     COLONOSCOPY N/A 05/27/2021   Procedure: COLONOSCOPY;  Surgeon:  [name], MD;  Location: [facility] ENDOSCOPY;  Service: Endoscopy;  Laterality: N/A;   DIAGNOSTIC LAPAROSCOPY     ENDOMETRIAL BIOPSY     ESOPHAGOGASTRODUODENOSCOPY     ESOPHAGOGASTRODUODENOSCOPY N/A 05/27/2021   Procedure: ESOPHAGOGASTRODUODENOSCOPY (EGD);  Surgeon: [name], MD;  Location: [facility] ENDOSCOPY;  Service: Endoscopy;  Laterality: N/A;   INDUCED ABORTION     LAPAROSCOPIC GASTRIC BAND REMOVAL WITH LAPAROSCOPIC GASTRIC SLEEVE RESECTION     LAPAROSCOPIC GASTRIC BANDING     MIRINGOTOMY      Family History  Problem Relation Age of Onset   Hypertension Mother    Heart attack Father    Congenital heart disease Father    Hypertension Father    Hyperlipidemia Father    Breast cancer Maternal Grandmother     Social History   Tobacco Use   Smoking status: Some Days    Types: Cigarettes    Last attempt to quit: 10/21/2017    Years since quitting: 3.7   Smokeless tobacco: Never  Vaping Use   Vaping Use: Former  Substance Use Topics   Alcohol use: Not Currently    Alcohol/week: 12.0 standard drinks    Types: 12 Cans of beer per week   Drug use: No    No current facility-administered medications for this encounter.  Current Outpatient Medications:    citalopram (CELEXA) 40 MG tablet, Take by mouth., Disp: , Rfl:  hydrochlorothiazide (HYDRODIURIL) 12.5 MG tablet, Take 1 tablet (12.5 mg total) by mouth daily., Disp: 30 tablet, Rfl: 0   citalopram (CELEXA) 40 MG tablet, Take 40 mg by mouth daily., Disp: , Rfl:    fluticasone (FLONASE) 50 MCG/ACT nasal spray, Place into the nose., Disp: , Rfl:    hydrOXYzine (ATARAX/VISTARIL) 25 MG tablet, Take 1 tablet (25 mg total) by mouth every 8 (eight) hours as needed for itching., Disp: 30 tablet, Rfl: 0   ondansetron (ZOFRAN-ODT) 4 MG disintegrating tablet, Take 4 mg by mouth every 6 (six) hours as needed., Disp: , Rfl: 0   pantoprazole (PROTONIX) 40 MG tablet, Take by mouth., Disp: , Rfl:    promethazine (PHENERGAN)  12.5 MG tablet, , Disp: , Rfl:   Allergies  Allergen Reactions   Benadryl [Diphenhydramine] Other (See Comments)    Hyper activity   Diphenhydramine Hcl    Shellfish Allergy Nausea And Vomiting   Shrimp Extract Allergy Skin Test Nausea And Vomiting     ROS  As noted in HPI.   Physical Exam  BP (!) 128/100 (BP Location: Left Arm) Comment: 1st.   Pulse 76    Temp 98.2 F (36.8 C) (Oral)    Resp 20    Ht 5\' 3"  (1.6 m)    Wt 131.5 kg    SpO2 100%    BMI 51.37 kg/m  BP Readings from Last 3 Encounters:  07/19/21 (!) 128/100  05/27/21 (!) 162/94  03/18/21 140/90    Constitutional: Well developed, well nourished, no acute distress Eyes:  EOMI, conjunctiva normal bilaterally HENT: Normocephalic, atraumatic,mucus membranes moist Respiratory: Normal inspiratory effort lungs clear bilaterally Cardiovascular: Normal rate, regular rhythm, no murmurs, rubs, gallops GI: nondistended skin: No rash, skin intact Musculoskeletal: Trace edema bilateral lower extremities Neurologic: Alert & oriented x 3, no focal neuro deficits Psychiatric: Speech and behavior appropriate   ED Course   Medications - No data to display  Orders Placed This Encounter  Procedures   TSH    Standing Status:   Standing    Number of Occurrences:   1   Basic metabolic panel    Standing Status:   Standing    Number of Occurrences:   1    Results for orders placed or performed during the hospital encounter of 07/19/21 (from the past 24 hour(s))  Basic metabolic panel     Status: None   Collection Time: 07/19/21  2:39 PM  Result Value Ref Range   Sodium 136 135 - 145 mmol/L   Potassium 4.3 3.5 - 5.1 mmol/L   Chloride 103 98 - 111 mmol/L   CO2 26 22 - 32 mmol/L   Glucose, Bld 97 70 - 99 mg/dL   BUN 10 6 - 20 mg/dL   Creatinine, Ser 0.81 0.44 - 1.00 mg/dL   Calcium 9.4 8.9 - 10.3 mg/dL   GFR, Estimated >60 >60 mL/min   Anion gap 7 5 - 15   No results found.  ED Clinical Impression  1.  Hypertension, unspecified type      ED Assessment/Plan  Patient's cuff: 137/94, heart rate 63, Clinic cuff: 130/103 heart rate 59  Patient's blood pressure cuff is accurate.  Checking BMP, TSH.  will start her on low-dose hydrochlorothiazide in the meantime, because she is reporting elevated blood pressure for the past several months.  Patient to keep a log of her blood pressure, measure it twice a day, once in the morning, once at night and follow-up with her doctor  as scheduled on 1/23.  She states that her PCP will follow-up on the TSH if it is abnormal.  Discussed when to discontinue the HCTZ.  BMP, TSH normal.  Discussed labs, MDM, treatment plan, and plan for follow-up with patient. Discussed sn/sx that should prompt return to the ED. patient agrees with plan.   Meds ordered this encounter  Medications   hydrochlorothiazide (HYDRODIURIL) 12.5 MG tablet    Sig: Take 1 tablet (12.5 mg total) by mouth daily.    Dispense:  30 tablet    Refill:  0      *This clinic note was created using [employer]. Therefore, there may be occasional mistakes despite careful proofreading.  ?    Melynda Ripple, MD 07/20/21 [phone]

## 2021-07-19 NOTE — ED Triage Notes (Signed)
Patient is here for "Elevated BP". Has been elevated at home. Last reading "this morning around 1030 am, 178/116, approximate". Using Upper arm monitor. "Heart has been racing at times". Some sob at times. Recent "Cold", better now. Colonoscopy a few wks ago, BP noticed "was high. No Chest pain. Elevated pressures seem to be "worse in the evenings".

## 2021-07-20 LAB — TSH: TSH: 1.601 u[IU]/mL (ref 0.350–4.500)

## 2022-04-09 ENCOUNTER — Other Ambulatory Visit: Payer: Self-pay | Admitting: Family Medicine

## 2022-04-09 ENCOUNTER — Other Ambulatory Visit: Payer: Self-pay | Admitting: Obstetrics and Gynecology

## 2022-04-09 DIAGNOSIS — Z1231 Encounter for screening mammogram for malignant neoplasm of breast: Secondary | ICD-10-CM

## 2022-04-28 ENCOUNTER — Ambulatory Visit: Payer: BC Managed Care – PPO

## 2022-05-19 ENCOUNTER — Ambulatory Visit: Payer: BC Managed Care – PPO

## 2022-05-21 ENCOUNTER — Ambulatory Visit: Payer: BC Managed Care – PPO

## 2022-05-21 ENCOUNTER — Ambulatory Visit
Admission: RE | Admit: 2022-05-21 | Discharge: 2022-05-21 | Disposition: A | Discharge status: Home / Self Care | Payer: BC Managed Care – PPO | Source: Ambulatory Visit | Attending: Obstetrics and Gynecology | Admitting: Obstetrics and Gynecology

## 2022-05-21 DIAGNOSIS — Z1231 Encounter for screening mammogram for malignant neoplasm of breast: Secondary | ICD-10-CM | POA: Insufficient documentation

## 2022-09-20 ENCOUNTER — Encounter: Payer: Self-pay | Admitting: Emergency Medicine

## 2022-09-20 ENCOUNTER — Ambulatory Visit
Admission: EM | Admit: 2022-09-20 | Discharge: 2022-09-20 | Disposition: A | Discharge status: Home / Self Care | Payer: BC Managed Care – PPO | Attending: Emergency Medicine | Admitting: Emergency Medicine

## 2022-09-20 DIAGNOSIS — L03011 Cellulitis of right finger: Secondary | ICD-10-CM

## 2022-09-20 MED ORDER — DOXYCYCLINE HYCLATE 100 MG PO CAPS: 100.0000 mg | ORAL_CAPSULE | Freq: Two times a day (BID) | ORAL | 0 refills | Status: AC

## 2022-09-20 NOTE — Discharge Instructions (Signed)
Today you are being treated for an infection of the tissue surrounding your fingernail  Take doxycycline every morning and every evening for 7 days, daily see improvement in about 48 hours and steady progression from there  You may help warm compresses to the affected area or complete warm soaks to help soften tissue, for comfort and to facilitate further drainage  You may use Tylenol or Motrin every 6 hours as needed for any pain  You may follow-up with his urgent care as needed if symptoms persist or worsen

## 2022-09-20 NOTE — ED Triage Notes (Signed)
Patient c/o redness, swelling and pain in the fingertip of her right 3rd finger for a week. Patient denies injury or fall.  Patient denies any drainage for the area.

## 2022-09-20 NOTE — ED Provider Notes (Signed)
[facility]    CSN: [id number] Arrival date & time: 09/20/22  1344      History   Chief Complaint Chief Complaint  Patient presents with   finger pain    Right 3rd finger    HPI Bianca Mercer is a 52 y.o. female.   Patient presents for evaluation of pain and swelling to the fingernail of the right middle finger beginning 7 days ago.  Endorses that she picks at nails and pulled out a ingrown which is most likely the source of her infection.  7 days ago was able to squeeze the area and expel purulent drainage.  Symptoms temporarily improved before recurring.  Denies fevers.  Past Medical History:  Diagnosis Date   Allergic rhinitis    Anemia    Bowel perforation (HCC)    Depression    Depression    Dysrhythmia    Fibroid    GERD (gastroesophageal reflux disease)    Heart murmur    Hypertension    Ileus ([place])    Infertility, female    Obese    Sleep apnea     There are no problems to display for this patient.   Past Surgical History:  Procedure Laterality Date   CHOLECYSTECTOMY     COLONOSCOPY N/A 05/27/2021   Procedure: COLONOSCOPY;  Surgeon: [name], MD;  Location: [facility] ENDOSCOPY;  Service: Endoscopy;  Laterality: N/A;   DIAGNOSTIC LAPAROSCOPY     ENDOMETRIAL BIOPSY     ESOPHAGOGASTRODUODENOSCOPY     ESOPHAGOGASTRODUODENOSCOPY N/A 05/27/2021   Procedure: ESOPHAGOGASTRODUODENOSCOPY (EGD);  Surgeon: [name], MD;  Location: [facility] ENDOSCOPY;  Service: Endoscopy;  Laterality: N/A;   INDUCED ABORTION     LAPAROSCOPIC GASTRIC BAND REMOVAL WITH LAPAROSCOPIC GASTRIC SLEEVE RESECTION     LAPAROSCOPIC GASTRIC BANDING     MIRINGOTOMY      OB History   No obstetric history on file.      Home Medications    Prior to Admission medications   Medication Sig Start Date End Date Taking? Authorizing Provider  citalopram (CELEXA) 40 MG tablet Take 40 mg by mouth daily.   Yes [provider]  doxycycline (VIBRAMYCIN) 100 MG  capsule Take 1 capsule (100 mg total) by mouth 2 (two) times daily. 09/20/22  Yes Sorayah Schrodt R, NP  hydrochlorothiazide (HYDRODIURIL) 12.5 MG tablet Take 1 tablet (12.5 mg total) by mouth daily. 07/19/21  Yes Melynda Ripple, MD  pantoprazole (PROTONIX) 40 MG tablet Take by mouth. 10/19/18  Yes [provider]  metFORMIN (GLUCOPHAGE-XR) 500 MG 24 hr tablet Take by mouth. 11/02/19 07/02/20  [provider]  citalopram (CELEXA) 40 MG tablet Take by mouth. 08/04/10   [provider]  fluticasone (FLONASE) 50 MCG/ACT nasal spray Place into the nose.    [provider]  hydrOXYzine (ATARAX/VISTARIL) 25 MG tablet Take 1 tablet (25 mg total) by mouth every 8 (eight) hours as needed for itching. 03/18/21   Coral Spikes, DO  ondansetron (ZOFRAN-ODT) 4 MG disintegrating tablet Take 4 mg by mouth every 6 (six) hours as needed. 10/22/17   [provider]  promethazine (PHENERGAN) 12.5 MG tablet  11/09/17   [provider]    Family History Family History  Problem Relation Age of Onset   Hypertension Mother    Heart attack Father    Congenital heart disease Father    Hypertension Father    Hyperlipidemia Father    Breast cancer Maternal Grandmother  Social History Social History   Tobacco Use   Smoking status: Some Days    Types: Cigarettes    Last attempt to quit: 10/21/2017    Years since quitting: 4.9   Smokeless tobacco: Never  Vaping Use   Vaping Use: Former  Substance Use Topics   Alcohol use: Not Currently    Alcohol/week: 12.0 standard drinks of alcohol    Types: 12 Cans of beer per week   Drug use: No     Allergies   Benadryl [diphenhydramine], Diphenhydramine hcl, Shellfish allergy, and Shrimp extract   Review of Systems Review of Systems   Physical Exam Triage Vital Signs ED Triage Vitals  Enc Vitals Group     BP 09/20/22 1512 (!) 142/85     Pulse Rate 09/20/22 1512 62     Resp 09/20/22 1512 14     Temp  09/20/22 1512 98.6 F (37 C)     Temp Source 09/20/22 1512 Oral     SpO2 09/20/22 1512 100 %     Weight 09/20/22 1509 289 lb 14.5 oz (131.5 kg)     Height 09/20/22 1509 5\' 3"  (1.6 m)     Head Circumference --      Peak Flow --      Pain Score 09/20/22 1509 5     Pain Loc --      Pain Edu? --      Excl. in [place]? --    No data found.  Updated Vital Signs BP (!) 142/85 (BP Location: Right Arm)   Pulse 62   Temp 98.6 F (37 C) (Oral)   Resp 14   Ht 5\' 3"  (1.6 m)   Wt 289 lb 14.5 oz (131.5 kg)   SpO2 100%   BMI 51.35 kg/m   Visual Acuity Right Eye Distance:   Left Eye Distance:   Bilateral Distance:    Right Eye Near:   Left Eye Near:    Bilateral Near:     Physical Exam Constitutional:      Appearance: Normal appearance.  Eyes:     Extraocular Movements: Extraocular movements intact.  Pulmonary:     Effort: Pulmonary effort is normal.  Skin:    Comments: Erythema, mild to moderate swelling and tenderness present to the medial and proximal aspect of the right nailbed, sensation intact, capillary refill less than 3, has full range of motion of the finger, 2+ radial pulse  Neurological:     Mental Status: She is alert and oriented to person, place, and time. Mental status is at baseline.      UC Treatments / Results  Labs (all labs ordered are listed, but only abnormal results are displayed) Labs Reviewed - No data to display  EKG   Radiology No results found.  Procedures Procedures (including critical care time)  Medications Ordered in UC Medications - No data to display  Initial Impression / Assessment and Plan / UC Course  I have reviewed the triage vital signs and the nursing notes.  Pertinent labs & imaging results that were available during my care of the patient were reviewed by me and considered in my medical decision making (see chart for details).     Paronychia of right middle finger  Presentation is consistent with infection, discussed  with patient, at this time there is no pus underneath the skin and site appears to be swollen, discussed this with patient therefore will not attempt incision and drainage, prescribed doxycycline and recommended warm compresses or  soaks to help provide comfort, may use over-the-counter analgesics for additional comfort given strict precautions to follow-up for reevaluation for any concerns regarding healing Final Clinical Impressions(s) / UC Diagnoses   Final diagnoses:  Paronychia of right middle finger     Discharge Instructions      Today you are being treated for an infection of the tissue surrounding your fingernail  Take doxycycline every morning and every evening for 7 days, daily see improvement in about 48 hours and steady progression from there  You may help warm compresses to the affected area or complete warm soaks to help soften tissue, for comfort and to facilitate further drainage  You may use Tylenol or Motrin every 6 hours as needed for any pain  You may follow-up with his urgent care as needed if symptoms persist or worsen   ED Prescriptions     Medication Sig Dispense Auth. Provider   doxycycline (VIBRAMYCIN) 100 MG capsule Take 1 capsule (100 mg total) by mouth 2 (two) times daily. 14 capsule Angela Vazguez, Leitha Schuller, NP      PDMP not reviewed this encounter.   Hans Eden, NP 09/20/22 1528

## 2023-02-20 ENCOUNTER — Ambulatory Visit
Admission: EM | Admit: 2023-02-20 | Discharge: 2023-02-20 | Disposition: A | Discharge status: Home / Self Care | Payer: BC Managed Care – PPO

## 2023-02-20 ENCOUNTER — Encounter: Payer: Self-pay | Admitting: Emergency Medicine

## 2023-02-20 DIAGNOSIS — I10 Essential (primary) hypertension: Secondary | ICD-10-CM

## 2023-02-20 NOTE — Discharge Instructions (Addendum)
-  BP is borderline. -Keep checking BP and keep a log.  Make sure you are checking it when you are rested and not stress. - Follow-up with PCP regarding BP and if it continues to be greater than 140/90. - Try to lose weight, exercise, reduce dietary sodium.

## 2023-02-20 NOTE — ED Triage Notes (Signed)
Patient reports history of hypertension.  Patient reports elevated BP this week around 145/96.  Patient states that she talked to the triage nurse at her PCP office yesterday and told her to come to an UC to be seen since they did not have an appointment.  Patient was started on hydrochlorothiazide last year and patient states that she never took it.  Patient denies any HAs or vision changes.

## 2023-02-20 NOTE — ED Provider Notes (Signed)
[facility]    CSN: [id number] Arrival date & time: 02/20/23  1111      History   Chief Complaint Chief Complaint  Patient presents with   Hypertension    HPI Bianca Mercer is a 52 y.o. female presenting for concerns of elevated blood pressure.  She reports that she has been told in the past she did have high blood pressure.  She says that she was prescribed a diuretic last year but she never took it because she thought she did not need it.  She reports blood pressures in the 140s over upper 90s.  The highest she has seen has been 150/90.  She says she became concerned because she had a CT for something unrelated and was found to have aortic atherosclerosis.  Very mildly elevated cholesterol year and a half ago but has not had it checked since.  Next appointment PCP is not until early next year.  She says occasionally she will feel little dizzy/nauseous and have a chest tightness and that concerned her.  She is not feeling any other symptoms at this time and feels relatively well.  HPI  Past Medical History:  Diagnosis Date   Allergic rhinitis    Anemia    Bowel perforation (HCC)    Depression    Depression    Dysrhythmia    Fibroid    GERD (gastroesophageal reflux disease)    Heart murmur    Hypertension    Ileus (HCC)    Infertility, female    Obese    Sleep apnea     There are no problems to display for this patient.   Past Surgical History:  Procedure Laterality Date   CHOLECYSTECTOMY     COLONOSCOPY N/A 05/27/2021   Procedure: COLONOSCOPY;  Surgeon: Regis Bill, MD;  Location: [facility] ENDOSCOPY;  Service: Endoscopy;  Laterality: N/A;   DIAGNOSTIC LAPAROSCOPY     ENDOMETRIAL BIOPSY     ESOPHAGOGASTRODUODENOSCOPY     ESOPHAGOGASTRODUODENOSCOPY N/A 05/27/2021   Procedure: ESOPHAGOGASTRODUODENOSCOPY (EGD);  Surgeon: Regis Bill, MD;  Location: [facility] ENDOSCOPY;  Service: Endoscopy;  Laterality: N/A;   INDUCED ABORTION     LAPAROSCOPIC  GASTRIC BAND REMOVAL WITH LAPAROSCOPIC GASTRIC SLEEVE RESECTION     LAPAROSCOPIC GASTRIC BANDING     MIRINGOTOMY      OB History   No obstetric history on file.      Home Medications    Prior to Admission medications   Medication Sig Start Date End Date Taking? Authorizing Provider  citalopram (CELEXA) 40 MG tablet Take 40 mg by mouth daily.   Yes [provider]  pantoprazole (PROTONIX) 40 MG tablet Take by mouth. 10/19/18  Yes [provider]  metFORMIN (GLUCOPHAGE-XR) 500 MG 24 hr tablet Take by mouth. 11/02/19 07/02/20  [provider]  citalopram (CELEXA) 40 MG tablet Take by mouth. 08/04/10   [provider]  doxycycline (VIBRAMYCIN) 100 MG capsule Take 1 capsule (100 mg total) by mouth 2 (two) times daily. 09/20/22   White, Elita Boone, NP  fluticasone (FLONASE) 50 MCG/ACT nasal spray Place into the nose.    [provider]  hydrochlorothiazide (HYDRODIURIL) 12.5 MG tablet Take 1 tablet (12.5 mg total) by mouth daily. 07/19/21   Domenick Gong, MD  hydrOXYzine (ATARAX/VISTARIL) 25 MG tablet Take 1 tablet (25 mg total) by mouth every 8 (eight) hours as needed for itching. 03/18/21   Tommie Sams, DO  ondansetron (ZOFRAN-ODT) 4 MG disintegrating tablet Take 4 mg  by mouth every 6 (six) hours as needed. 10/22/17   [provider]  promethazine (PHENERGAN) 12.5 MG tablet  11/09/17   [provider]    Family History Family History  Problem Relation Age of Onset   Hypertension Mother    Heart attack Father    Congenital heart disease Father    Hypertension Father    Hyperlipidemia Father    Breast cancer Maternal Grandmother     Social History Social History   Tobacco Use   Smoking status: Former    Current packs/day: 0.00    Types: Cigarettes    Quit date: 10/21/2017    Years since quitting: 5.3   Smokeless tobacco: Never  Vaping Use   Vaping status: Former  Substance Use Topics   Alcohol use: Not Currently     Alcohol/week: 12.0 standard drinks of alcohol    Types: 12 Cans of beer per week   Drug use: No     Allergies   Benadryl [diphenhydramine], Diphenhydramine hcl, Shellfish allergy, and Shrimp extract   Review of Systems Review of Systems  Constitutional:  Negative for fatigue.  Eyes:  Negative for visual disturbance.  Respiratory:  Negative for chest tightness and shortness of breath.   Cardiovascular:  Negative for chest pain and palpitations.  Neurological:  Negative for dizziness, syncope, weakness and headaches.     Physical Exam Triage Vital Signs ED Triage Vitals  Encounter Vitals Group     BP 02/20/23 1217 (!) 146/96     Systolic BP Percentile --      Diastolic BP Percentile --      Pulse Rate 02/20/23 1217 76     Resp 02/20/23 1217 15     Temp 02/20/23 1217 98.8 F (37.1 C)     Temp Source 02/20/23 1217 Oral     SpO2 02/20/23 1217 96 %     Weight 02/20/23 1214 275 lb (124.7 kg)     Height 02/20/23 1214 5\' 3"  (1.6 m)     Head Circumference --      Peak Flow --      Pain Score 02/20/23 1214 0     Pain Loc --      Pain Education --      Exclude from Growth Chart --    No data found.  Updated Vital Signs BP 131/84 (BP Location: Left Arm)   Pulse 76   Temp 98.8 F (37.1 C) (Oral)   Resp 15   Ht 5\' 3"  (1.6 m)   Wt 275 lb (124.7 kg)   LMP 07/04/2020 (Approximate)   SpO2 96%   BMI 48.71 kg/m      Physical Exam Vitals and nursing note reviewed.  Constitutional:      General: She is not in acute distress.    Appearance: Normal appearance. She is obese. She is not ill-appearing or toxic-appearing.  HENT:     Head: Normocephalic and atraumatic.     Nose: Nose normal.     Mouth/Throat:     Mouth: Mucous membranes are moist.     Pharynx: Oropharynx is clear.  Eyes:     General: No scleral icterus.       Right eye: No discharge.        Left eye: No discharge.     Conjunctiva/sclera: Conjunctivae normal.  Cardiovascular:     Rate and Rhythm:  Normal rate and regular rhythm.     Heart sounds: Normal heart sounds.  Pulmonary:  Effort: Pulmonary effort is normal. No respiratory distress.     Breath sounds: Normal breath sounds.  Musculoskeletal:     Cervical back: Neck supple.  Skin:    General: Skin is dry.  Neurological:     General: No focal deficit present.     Mental Status: She is alert. Mental status is at baseline.     Motor: No weakness.     Gait: Gait normal.  Psychiatric:        Mood and Affect: Mood normal.        Behavior: Behavior normal.        Thought Content: Thought content normal.      UC Treatments / Results  Labs (all labs ordered are listed, but only abnormal results are displayed) Labs Reviewed - No data to display  EKG   Radiology No results found.  Procedures Procedures (including critical care time)  Medications Ordered in UC Medications - No data to display  Initial Impression / Assessment and Plan / UC Course  I have reviewed the triage vital signs and the nursing notes.  Pertinent labs & imaging results that were available during my care of the patient were reviewed by me and considered in my medical decision making (see chart for details).   52 year old female presents for elevated blood pressure readings.  History of hypertension.  States she was prescribed a diuretic but never took it.  Initial blood pressure here is 146/96.  Recheck 132/86 and 131/84.  Patient is denying chest pain, dizziness, headaches, vision changes, palpitations, shortness of breath.  Exam benign.  Discussed with patient restarting diuretic versus lifestyle modifications.  She would like to hold off on taking any medication if possible.  We discussed dietary changes, weight loss, exercise and follow-up with PCP.  Reviewed ED precautions.   Final Clinical Impressions(s) / UC Diagnoses   Final diagnoses:  Essential hypertension     Discharge Instructions      -BP is borderline. -Keep checking  BP and keep a log.  Make sure you are checking it when you are rested and not stress. - Follow-up with PCP regarding BP and if it continues to be greater than 140/90. - Try to lose weight, exercise, reduce dietary sodium.     ED Prescriptions   None    PDMP not reviewed this encounter.   Shirlee Latch, PA-C 02/20/23 1259

## 2023-07-06 ENCOUNTER — Other Ambulatory Visit: Payer: Self-pay | Admitting: Student

## 2023-07-06 DIAGNOSIS — Z1231 Encounter for screening mammogram for malignant neoplasm of breast: Secondary | ICD-10-CM

## 2023-07-21 ENCOUNTER — Ambulatory Visit
Admission: RE | Admit: 2023-07-21 | Discharge: 2023-07-21 | Disposition: A | Discharge status: Home / Self Care | Payer: 59 | Source: Ambulatory Visit | Attending: Student | Admitting: Student

## 2023-07-21 DIAGNOSIS — Z1231 Encounter for screening mammogram for malignant neoplasm of breast: Secondary | ICD-10-CM | POA: Diagnosis present

## 2024-02-17 ENCOUNTER — Ambulatory Visit: Admission: EM | Admit: 2024-02-17 | Discharge: 2024-02-17 | Discharge status: Absent without leave

## 2024-02-18 ENCOUNTER — Ambulatory Visit: Payer: Self-pay

## 2024-08-11 ENCOUNTER — Other Ambulatory Visit: Payer: Self-pay | Admitting: Student

## 2024-08-11 DIAGNOSIS — Z1231 Encounter for screening mammogram for malignant neoplasm of breast: Secondary | ICD-10-CM

## 2024-09-12 ENCOUNTER — Ambulatory Visit
